# Patient Record
Sex: Female | Born: 2000 | Race: Black or African American | Hispanic: No | Marital: Single | Smoking: Never smoker
Health system: Southern US, Community
[De-identification: ages and names within clinical notes are randomized; demographics above are authoritative.]

## PROBLEM LIST (undated history)

## (undated) DIAGNOSIS — E538 Deficiency of other specified B group vitamins: Secondary | ICD-10-CM

## (undated) DIAGNOSIS — R7303 Prediabetes: Secondary | ICD-10-CM

## (undated) DIAGNOSIS — J45909 Unspecified asthma, uncomplicated: Secondary | ICD-10-CM

## (undated) DIAGNOSIS — R5383 Other fatigue: Secondary | ICD-10-CM

## (undated) DIAGNOSIS — E669 Obesity, unspecified: Secondary | ICD-10-CM

## (undated) DIAGNOSIS — F419 Anxiety disorder, unspecified: Secondary | ICD-10-CM

## (undated) DIAGNOSIS — R0602 Shortness of breath: Secondary | ICD-10-CM

## (undated) DIAGNOSIS — F32A Depression, unspecified: Secondary | ICD-10-CM

## (undated) HISTORY — DX: Other fatigue: R53.83

## (undated) HISTORY — DX: Prediabetes: R73.03

## (undated) HISTORY — DX: Obesity, unspecified: E66.9

## (undated) HISTORY — DX: Deficiency of other specified B group vitamins: E53.8

## (undated) HISTORY — DX: Unspecified asthma, uncomplicated: J45.909

## (undated) HISTORY — DX: Shortness of breath: R06.02

## (undated) HISTORY — DX: Anxiety disorder, unspecified: F41.9

## (undated) HISTORY — DX: Depression, unspecified: F32.A

---
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
## 2022-01-05 ENCOUNTER — Encounter (INDEPENDENT_AMBULATORY_CARE_PROVIDER_SITE_OTHER): Payer: Self-pay | Admitting: Family Medicine

## 2022-01-05 ENCOUNTER — Ambulatory Visit (INDEPENDENT_AMBULATORY_CARE_PROVIDER_SITE_OTHER): Payer: 59 | Admitting: Family Medicine

## 2022-01-05 VITALS — BP 113/76 | HR 67 | Temp 98.1°F | Ht 61.0 in | Wt 297.0 lb

## 2022-01-05 DIAGNOSIS — R0602 Shortness of breath: Secondary | ICD-10-CM | POA: Diagnosis not present

## 2022-01-05 DIAGNOSIS — J45909 Unspecified asthma, uncomplicated: Secondary | ICD-10-CM

## 2022-01-05 DIAGNOSIS — E559 Vitamin D deficiency, unspecified: Secondary | ICD-10-CM

## 2022-01-05 DIAGNOSIS — F5089 Other specified eating disorder: Secondary | ICD-10-CM | POA: Diagnosis not present

## 2022-01-05 DIAGNOSIS — Z1331 Encounter for screening for depression: Secondary | ICD-10-CM | POA: Diagnosis not present

## 2022-01-05 DIAGNOSIS — E669 Obesity, unspecified: Secondary | ICD-10-CM | POA: Diagnosis not present

## 2022-01-05 DIAGNOSIS — Z9189 Other specified personal risk factors, not elsewhere classified: Secondary | ICD-10-CM

## 2022-01-05 DIAGNOSIS — R5383 Other fatigue: Secondary | ICD-10-CM

## 2022-01-05 DIAGNOSIS — J4540 Moderate persistent asthma, uncomplicated: Secondary | ICD-10-CM

## 2022-01-05 DIAGNOSIS — J302 Other seasonal allergic rhinitis: Secondary | ICD-10-CM | POA: Diagnosis not present

## 2022-01-05 DIAGNOSIS — Z68.41 Body mass index (BMI) pediatric, greater than or equal to 95th percentile for age: Secondary | ICD-10-CM

## 2022-01-06 LAB — COMPREHENSIVE METABOLIC PANEL
ALT: 16 IU/L (ref 0–32)
AST: 20 IU/L (ref 0–40)
Albumin/Globulin Ratio: 1.2 (ref 1.2–2.2)
Albumin: 4.2 g/dL (ref 3.9–5.0)
Alkaline Phosphatase: 83 IU/L (ref 42–106)
BUN/Creatinine Ratio: 15 (ref 9–23)
BUN: 14 mg/dL (ref 6–20)
Bilirubin Total: 0.4 mg/dL (ref 0.0–1.2)
CO2: 22 mmol/L (ref 20–29)
Calcium: 9.8 mg/dL (ref 8.7–10.2)
Chloride: 103 mmol/L (ref 96–106)
Creatinine, Ser: 0.96 mg/dL (ref 0.57–1.00)
Globulin, Total: 3.4 g/dL (ref 1.5–4.5)
Glucose: 79 mg/dL (ref 70–99)
Potassium: 4.5 mmol/L (ref 3.5–5.2)
Sodium: 140 mmol/L (ref 134–144)
Total Protein: 7.6 g/dL (ref 6.0–8.5)
eGFR: 88 mL/min/{1.73_m2} (ref >59)

## 2022-01-06 LAB — CBC WITH DIFFERENTIAL/PLATELET
Basophils Absolute: 0 10*3/uL (ref 0.0–0.2)
Basos: 1 %
EOS (ABSOLUTE): 0.1 10*3/uL (ref 0.0–0.4)
Eos: 1 %
Hematocrit: 39.4 % (ref 34.0–46.6)
Hemoglobin: 12.6 g/dL (ref 11.1–15.9)
Immature Grans (Abs): 0 10*3/uL (ref 0.0–0.1)
Immature Granulocytes: 0 %
Lymphocytes Absolute: 2.3 10*3/uL (ref 0.7–3.1)
Lymphs: 43 %
MCH: 25.8 pg — ABNORMAL LOW (ref 26.6–33.0)
MCHC: 32 g/dL (ref 31.5–35.7)
MCV: 81 fL (ref 79–97)
Monocytes Absolute: 0.4 10*3/uL (ref 0.1–0.9)
Monocytes: 8 %
Neutrophils Absolute: 2.6 10*3/uL (ref 1.4–7.0)
Neutrophils: 47 %
Platelets: 413 10*3/uL (ref 150–450)
RBC: 4.88 x10E6/uL (ref 3.77–5.28)
RDW: 13.9 % (ref 11.7–15.4)
WBC: 5.4 10*3/uL (ref 3.4–10.8)

## 2022-01-06 LAB — VITAMIN D 25 HYDROXY (VIT D DEFICIENCY, FRACTURES): Vit D, 25-Hydroxy: 13.6 ng/mL — ABNORMAL LOW (ref 30.0–100.0)

## 2022-01-06 LAB — HEMOGLOBIN A1C
Est. average glucose Bld gHb Est-mCnc: 120 mg/dL
Hgb A1c MFr Bld: 5.8 % — ABNORMAL HIGH (ref 4.8–5.6)

## 2022-01-06 LAB — FOLATE: Folate: 11.8 ng/mL (ref >3.0)

## 2022-01-06 LAB — LIPID PANEL
Chol/HDL Ratio: 3.3 ratio (ref 0.0–4.4)
Cholesterol, Total: 204 mg/dL — ABNORMAL HIGH (ref 100–169)
HDL: 62 mg/dL (ref >39)
LDL Chol Calc (NIH): 119 mg/dL — ABNORMAL HIGH (ref 0–109)
Triglycerides: 129 mg/dL — ABNORMAL HIGH (ref 0–89)
VLDL Cholesterol Cal: 23 mg/dL (ref 5–40)

## 2022-01-06 LAB — VITAMIN B12: Vitamin B-12: 383 pg/mL (ref 232–1245)

## 2022-01-06 LAB — INSULIN, RANDOM: INSULIN: 23.2 u[IU]/mL (ref 2.6–24.9)

## 2022-01-06 LAB — T4, FREE: Free T4: 1.44 ng/dL (ref 0.93–1.60)

## 2022-01-06 LAB — TSH: TSH: 1.48 u[IU]/mL (ref 0.450–4.500)

## 2022-01-12 ENCOUNTER — Ambulatory Visit (INDEPENDENT_AMBULATORY_CARE_PROVIDER_SITE_OTHER): Payer: 59 | Admitting: Family Medicine

## 2022-01-19 ENCOUNTER — Ambulatory Visit (INDEPENDENT_AMBULATORY_CARE_PROVIDER_SITE_OTHER): Payer: Self-pay | Admitting: Family Medicine

## 2022-01-20 ENCOUNTER — Encounter (INDEPENDENT_AMBULATORY_CARE_PROVIDER_SITE_OTHER): Payer: Self-pay | Admitting: Family Medicine

## 2022-01-20 ENCOUNTER — Ambulatory Visit (INDEPENDENT_AMBULATORY_CARE_PROVIDER_SITE_OTHER): Payer: 59 | Admitting: Family Medicine

## 2022-01-20 VITALS — BP 102/70 | HR 60 | Temp 98.5°F | Ht 61.0 in | Wt 293.0 lb

## 2022-01-20 DIAGNOSIS — E785 Hyperlipidemia, unspecified: Secondary | ICD-10-CM

## 2022-01-20 DIAGNOSIS — F5089 Other specified eating disorder: Secondary | ICD-10-CM

## 2022-01-20 DIAGNOSIS — R7303 Prediabetes: Secondary | ICD-10-CM

## 2022-01-20 DIAGNOSIS — E538 Deficiency of other specified B group vitamins: Secondary | ICD-10-CM

## 2022-01-20 DIAGNOSIS — E559 Vitamin D deficiency, unspecified: Secondary | ICD-10-CM

## 2022-01-20 DIAGNOSIS — E66813 Obesity, class 3: Secondary | ICD-10-CM

## 2022-01-20 DIAGNOSIS — Z6841 Body Mass Index (BMI) 40.0 and over, adult: Secondary | ICD-10-CM

## 2022-01-20 DIAGNOSIS — E669 Obesity, unspecified: Secondary | ICD-10-CM

## 2022-01-20 MED ORDER — VITAMIN D (ERGOCALCIFEROL) 1.25 MG (50000 UNIT) PO CAPS: ORAL_CAPSULE | ORAL | 0 refills | Status: DC

## 2022-01-20 MED ORDER — CYANOCOBALAMIN 500 MCG PO TABS: 500.0000 ug | ORAL_TABLET | Freq: Every day | ORAL | Status: DC

## 2022-01-21 ENCOUNTER — Ambulatory Visit (INDEPENDENT_AMBULATORY_CARE_PROVIDER_SITE_OTHER): Payer: 59 | Admitting: Family Medicine

## 2022-01-21 NOTE — Progress Notes (Signed)
Chief Complaint:   OBESITY Melanie Blanchard (MR# [id number]) is a 21 y.o. female who presents for evaluation and treatment of obesity and related comorbidities. Current BMI is Body mass index is 56.12 kg/m. Melanie Blanchard has been struggling with her weight for many years and has been unsuccessful in either losing weight, maintaining weight loss, or reaching her healthy weight goal.  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 She lives with her mom only. Craves potatoes, fried chicken, and rice. Skips breakfast and mostly lunch as well. Worst habit is pizza and quick bites. She is here with her mom today.  Melanie Blanchard is currently in the action stage of change and ready to dedicate time achieving and maintaining a healthier weight. Melanie Blanchard is interested in becoming our patient and working on intensive lifestyle modifications including (but not limited to) diet and exercise for weight loss.  Melanie Blanchard's habits were reviewed today and are as follows: Her family eats meals together, she thinks her family will eat healthier with her, her desired weight loss is 112 lbs, she has been heavy most of her life, she started gaining weight in high school, her heaviest weight ever was 310 pounds, she has significant food cravings issues, she snacks frequently in the evenings, she skips meals frequently, she is frequently drinking liquids with calories, she frequently makes poor food choices, she frequently eats larger portions than normal, and she struggles with emotional eating.  Depression Screen Melanie Blanchard's Food and Mood (modified PHQ-9) score was 13.     01/05/2022    8:37 AM  Depression screen PHQ 2/9  Decreased Interest 3  Down, Depressed, Hopeless 1  PHQ - 2 Score 4  Altered sleeping 1  Tired, decreased energy 2  Change in appetite 3  Feeling bad or failure about yourself  1  Trouble concentrating 1  Moving slowly or fidgety/restless 1  Suicidal  thoughts 0  PHQ-9 Score 13  Difficult doing work/chores Somewhat difficult   Subjective:   1. Other fatigue Melanie Blanchard admits to daytime somnolence and admits to waking up still tired. Patient has a history of symptoms of daytime fatigue and morning fatigue. Melanie Blanchard generally gets 4 or 5 hours of sleep per night, and states that she has difficulty falling asleep. Snoring is present. Apneic episodes are not present. Epworth Sleepiness Score is 12. Melanie Blanchard doesn't feel OSA symptoms are present, neither does her mom but she has a hard time falling asleep.   2. SOB (shortness of breath) on exertion Melanie Blanchard notes increasing shortness of breath with exercising and seems to be worsening over time with weight gain. She notes getting out of breath sooner with activity than she used to. This has not gotten worse recently. Melanie Blanchard denies shortness of breath at rest or orthopnea.  3. Other disorder of eating, emotional eating Melanie Blanchard eats when she is stressed. Her PHQ-9 score is 13, but she denies feeling depressed. Mostly just stressed. Patient denies that she needs counseling at this time for emotional eating.   4. Reactive airway disease without complication, unspecified asthma severity, unspecified whether persistent Melanie Blanchard uses albuterol, and her PCP put her on Singulair in the past, and none for many years. RADx is environmentally mediated, uses albuterol rarely.   5. Seasonal allergies Melanie Blanchard is tolerating medication(s) well without side effects.  Medication compliance is good as patient endorses taking it as prescribed.  The patient denies additional concerns regarding this condition.  6. Vitamin D deficiency She is currently taking prescription vitamin D 50,000 IU each week. She denies nausea, vomiting or muscle weakness.  7. At risk for impaired metabolic function Melanie Blanchard is at increased risk for impaired metabolic function due to current nutrition and muscle  mass.  Assessment/Plan:   Orders Placed This Encounter  Procedures   CBC with Differential/Platelet   Comprehensive metabolic panel   Folate   Hemoglobin A1c   Insulin, random   Lipid panel   T4, free   Vitamin B12   VITAMIN D 25 Hydroxy (Vit-D Deficiency, Fractures)   TSH   TSH   EKG 12-Lead    1. Other fatigue Melanie Blanchard does feel that her weight is causing her energy to be lower than it should be. Fatigue may be related to obesity, depression or many other causes. Labs will be ordered, and in the meanwhile, Melanie Blanchard will focus on self care including making healthy food choices, increasing physical activity and focusing on stress reduction. Sleep meditation or sleep stories were discussed  with the patient as a manner to help "shut" her brain down at night to help her sleep. She was advised to use counseling at her school as needed if her school work stress increases.   - EKG 12-Lead - CBC with Differential/Platelet - Comprehensive metabolic panel - Folate - Hemoglobin A1c - Insulin, random - Lipid panel - T4, free - Vitamin B12 - VITAMIN D 25 Hydroxy (Vit-D Deficiency, Fractures) - TSH  2. SOB (shortness of breath) on exertion Melanie Blanchard does feel that she gets out of breath more easily that she used to when she exercises. Melanie Blanchard's shortness of breath appears to be obesity related and exercise induced. She has agreed to work on weight loss and gradually increase exercise to treat her exercise induced shortness of breath. Will continue to monitor closely.  - Lipid panel  3. Other disorder of eating, emotional eating We will monitor her symptoms closely. Behavior modification techniques were discussed today to help Melanie Blanchard deal with her emotional/non-hunger eating behaviors. Patient can use free counseling on campus if needed or Dr. Dewaine Blanchard. Orders and follow up as documented in patient record.   4. Reactive airway disease without complication, unspecified asthma severity,  unspecified whether persistent Discuss with PCP if Singulair is appropriate. Consider changing to Xyzal and refill albuterol as needed.   - CBC with Differential/Platelet - Comprehensive metabolic panel - Lipid panel  5. Seasonal allergies Melanie Blanchard's symptoms are stable currently. Will consider Singulair in the future if her allergies and RADx worsens.   6. Vitamin D deficiency We will check labs today. Low Vitamin D level contributes to fatigue and are associated with obesity, breast, and colon cancer. Melanie Blanchard will continue her medication per her PCP and will follow-up for routine testing of Vitamin D, at least 2-3 times per year to avoid over-replacement.  - VITAMIN D 25 Hydroxy (Vit-D Deficiency, Fractures)  7. Depression screening Melanie Blanchard had a positive depression screening. Depression is commonly associated with obesity and often results in emotional eating behaviors. We will monitor this closely and work on CBT to help improve the non-hunger eating patterns. Referral to Psychology may be required if no improvement is seen as she continues in our clinic.  8. At risk for impaired metabolic function Melanie Blanchard was given approximately 23 minutes of impaired  metabolic function prevention counseling today. We discussed intensive lifestyle modifications today with an emphasis on specific nutrition and exercise instructions and strategies.   Repetitive spaced learning was employed today  to elicit superior memory formation and behavioral change.  9. Class 3 severe obesity with serious comorbidity and body mass index (BMI) of 50.0 to 59.9 in adult, unspecified obesity type (HCC) Melanie Blanchard is currently in the action stage of change and her goal is to continue with weight loss efforts. I recommend Melanie Blanchard begin the structured treatment plan as follows:  She has agreed to the Category 2 Plan.  Exercise goals: As is.   Behavioral modification strategies: increasing lean protein intake, decreasing  simple carbohydrates, emotional eating strategies, and planning for success.  She was informed of the importance of frequent follow-up visits to maximize her success with intensive lifestyle modifications for her multiple health conditions. She was informed we would discuss her lab results at her next visit unless there is a critical issue that needs to be addressed sooner. Melanie Blanchard agreed to keep her next visit at the agreed upon time to discuss these results.  Objective:   Blood pressure 113/76, pulse 67, temperature 98.1 F (36.7 C), height 5\' 1"  (1.549 m), weight 297 lb (134.7 kg), last menstrual period 12/08/2021, SpO2 98 %. Body mass index is 56.12 kg/m.  EKG: Normal sinus rhythm, rate 72 BPM.  Indirect Calorimeter completed today shows a VO2 of 277 and a REE of 1915.  Her calculated basal metabolic rate is [id number] thus her basal metabolic rate is worse than expected.  General: Cooperative, alert, well developed, in no acute distress. HEENT: Conjunctivae and lids unremarkable. Cardiovascular: Regular rhythm.  Lungs: Normal work of breathing. Neurologic: No focal deficits.   Lab Results  Component Value Date   CREATININE 0.96 01/05/2022   BUN 14 01/05/2022   NA 140 01/05/2022   K 4.5 01/05/2022   CL 103 01/05/2022   CO2 22 01/05/2022   Lab Results  Component Value Date   ALT 16 01/05/2022   AST 20 01/05/2022   ALKPHOS 83 01/05/2022   BILITOT 0.4 01/05/2022   Lab Results  Component Value Date   HGBA1C 5.8 (H) 01/05/2022   HGBA1C 5.8 10/29/2021   HGBA1C 5.9 06/09/2017   HGBA1C 5.4 06/21/2013   Lab Results  Component Value Date   INSULIN 23.2 01/05/2022   Lab Results  Component Value Date   TSH 1.480 01/05/2022   Lab Results  Component Value Date   CHOL 204 (H) 01/05/2022   HDL 62 01/05/2022   LDLCALC 119 (H) 01/05/2022   LDLDIRECT 65.0 06/09/2017   TRIG 129 (H) 01/05/2022   CHOLHDL 3.3 01/05/2022   Lab Results  Component Value Date   WBC 5.4 01/05/2022    HGB 12.6 01/05/2022   HCT 39.4 01/05/2022   MCV 81 01/05/2022   PLT 413 01/05/2022   No results found for: IRON, TIBC, FERRITIN  Attestation Statements:   Reviewed by clinician on day of visit: allergies, medications, problem list, medical history, surgical history, family history, social history, and previous encounter notes.   Wilhemena Durie, am acting as transcriptionist for [employer], DO.  I have reviewed the above documentation for accuracy and completeness, and I agree with the above. Marjory Sneddon, D.O.  The [place] was signed into law in 2016 which includes the topic of electronic health records.  This provides immediate access to information in MyChart.  This includes consultation notes, operative notes, office notes, lab results and pathology reports.  If you have any questions about what you read please let us know at your next visit so we can discuss your concerns  and take corrective action if need be.  We are right here with you.

## 2022-01-26 ENCOUNTER — Ambulatory Visit (INDEPENDENT_AMBULATORY_CARE_PROVIDER_SITE_OTHER): Payer: 59 | Admitting: Family Medicine

## 2022-01-29 NOTE — Progress Notes (Signed)
Chief Complaint:   OBESITY Melanie Blanchard is here to discuss her progress with her obesity treatment plan along with follow-up of her obesity related diagnoses. Melanie Blanchard is on the Category 2 Plan with breakfast options and states she is following her eating plan approximately 75% of the time. Melanie Blanchard states she is not currently exercising.  Today's visit was #: 2 Starting weight: 297 lbs Starting date: 01/05/2022 Today's weight: 293 lbs Today's date: 01/20/2022 Total lbs lost to date: 4 Total lbs lost since last in-office visit: 4  Interim History: Melanie Blanchard is here today for her first follow-up office visit since starting the program with [place]. All blood work/ lab tests that were recently ordered by myself or an outside provider were reviewed with patient today per their request.  Extended time was spent counseling her on all new disease processes that were discovered or preexisting ones that are affected by BMI. She understands that many of these abnormalities will need to monitored regularly along with the current treatment plan of prudent dietary changes, in which we are making each and every office visit, to improve these health parameters. Pt reports that she works from 7p-11p and skipped dinner 60+% of the time. She is following the plan exactly for breakfast and lunch. Her snack calories include yogurt and string cheese. Pt does note some hunger, especially after skipping meals. She denies cravings.  Subjective:   1. Pre-diabetes New. Discussed labs with patient today. Melanie Blanchard's A1c is 5.8, and she denies ever being told that she is prediabetic.  Lab Results  Component Value Date   HGBA1C 5.8 (H) 01/05/2022   Lab Results  Component Value Date   INSULIN 23.2 01/05/2022   2. Vitamin D deficiency Worsening. Discussed labs with patient today. Pt's Vit D level is 13.6.  Lab Results  Component Value Date   VD25OH 13.6 (L) 01/05/2022   VD25OH 12.31 (L)  10/29/2021   3. Other disorder of eating, with emotional eating Discussed labs with patient today. Melanie Blanchard did not find herself doing any emotional eating the past couple of weeks. She denies issues or concerns. Pt denies depression.  4. Hyperlipidemia, unspecified hyperlipidemia type New. Discussed labs with patient today. Melanie Blanchard has elevated LDL and triglycerides. She is not on statin therapy. She has eaten a lot of saturated and trans fats in the past.  Lab Results  Component Value Date   ALT 16 01/05/2022   AST 20 01/05/2022   ALKPHOS 83 01/05/2022   BILITOT 0.4 01/05/2022   Lab Results  Component Value Date   CHOL 204 (H) 01/05/2022   HDL 62 01/05/2022   LDLCALC 119 (H) 01/05/2022   LDLDIRECT 65.0 06/09/2017   TRIG 129 (H) 01/05/2022   CHOLHDL 3.3 01/05/2022    5. B12 deficiency New. Discussed labs with patient today. Pt reports being tired and fatigued. In the past, she has eaten a rather unhealthy diet.  Assessment/Plan:  No orders of the defined types were placed in this encounter.   Medications Discontinued During This Encounter  Medication Reason   Vitamin D, Ergocalciferol, (DRISDOL) 1.25 MG (50000 UNIT) CAPS capsule Reorder     Meds ordered this encounter  Medications   Vitamin D, Ergocalciferol, (DRISDOL) 1.25 MG (50000 UNIT) CAPS capsule    Sig: 1 on wed and 1 po on sun    Dispense:  8 capsule    Refill:  0   vitamin B-12 (CYANOCOBALAMIN) 500 MCG tablet    Sig: Take 1 tablet (  500 mcg total) by mouth daily.     1. Pre-diabetes Melanie Blanchard will follow prudent nutritional plan, continue to work on weight loss, exercise, and decreasing simple carbohydrates to help decrease the risk of diabetes.  Extensive counseling done with pt today. We discussed the affects of simple carbohydrates on hunger and cravings.  2. Vitamin D deficiency - I discussed the importance of vitamin D to the patient's health and well-being.  - I reviewed possible symptoms of low  Vitamin D:  low energy, depressed mood, muscle aches, joint aches, osteoporosis etc. was reviewed with patient - low Vitamin D levels may be linked to an increased risk of cardiovascular events and even increased risk of cancers- such as colon and breast.  - ideal vitamin D levels reviewed with patient  - I recommend pt take a 50,000 IU twice weekly prescription vit D - see script below   - Informed patient this may be a lifelong thing, and she was encouraged to continue to take the medicine until told otherwise.    - weight loss will likely improve availability of vitamin D, thus encouraged Melanie Blanchard to continue with meal plan and their weight loss efforts to further improve this condition.  Thus, we will need to monitor levels regularly (every 3-4 mo on average) to keep levels within normal limits and prevent over supplementation. - pt's questions and concerns regarding this condition addressed.  Start- Vitamin D, Ergocalciferol, (DRISDOL) 1.25 MG (50000 UNIT) CAPS capsule; 1 on wed and 1 po on sun  Dispense: 8 capsule; Refill: 0  3. Other disorder of eating, with emotional eating Behavior modification techniques were discussed today to help Melanie Blanchard deal with her emotional/non-hunger eating behaviors.  Orders and follow up as documented in patient record. Pt declines need for counseling with Dr. Dewaine CongerBarker or any other. We will closely monitor.  4. Hyperlipidemia, unspecified hyperlipidemia type Cardiovascular risk and specific lipid/LDL goals reviewed.  We discussed several lifestyle modifications today and Melanie Blanchard will continue to work on diet, exercise and weight loss efforts. Orders and follow up as documented in patient record. Follow prudent nutritional plan and decrease saturated and trans fats. Extensive counseling done. Repeat labs in 4-6 months.  Counseling Intensive lifestyle modifications are the first line treatment for this issue. Dietary changes: Increase soluble fiber. Decrease simple  carbohydrates. Exercise changes: Moderate to vigorous-intensity aerobic activity 150 minutes per week if tolerated. Lipid-lowering medications: see documented in medical record.  5. B12 deficiency The diagnosis was reviewed with the patient. Counseling provided today, see below. We will continue to monitor. Orders and follow up as documented in patient record. Start B12 500 mcg daily. Repeat labs in 3 months.  Counseling The body needs vitamin B12: to make red blood cells; to make DNA; and to help the nerves work properly so they can carry messages from the brain to the body.  The main causes of vitamin B12 deficiency include dietary deficiency, digestive diseases, pernicious anemia, and having a surgery in which part of the stomach or small intestine is removed.  Certain medicines can make it harder for the body to absorb vitamin B12. These medicines include: heartburn medications; some antibiotics; some medications used to treat diabetes, gout, and high cholesterol.  In some cases, there are no symptoms of this condition. If the condition leads to anemia or nerve damage, various symptoms can occur, such as weakness or fatigue, shortness of breath, and numbness or tingling in your hands and feet.   Treatment:  May include taking vitamin  B12 supplements.  Avoid alcohol.  Eat lots of healthy foods that contain vitamin B12: Beef, pork, chicken, [place], and organ meats, such as liver.  Seafood: This includes clams, rainbow trout, salmon, tuna, and haddock. Eggs.  Cereal and dairy products that are fortified: This means that vitamin B12 has been added to the food.   Start- vitamin B-12 (CYANOCOBALAMIN) 500 MCG tablet; Take 1 tablet (500 mcg total) by mouth daily.  6. Obesity, current BMI 55.5 Melanie Blanchard is currently in the action stage of change. As such, her goal is to continue with weight loss efforts. She has agreed to the Category 2 Plan with breakfast and lunch options.   Pt will work on  getting all meal sin and not skipping dinner. She will change dinner with lunch proteins.  Exercise goals:  As is  Behavioral modification strategies: increasing lean protein intake, decreasing simple carbohydrates, increasing water intake, and planning for success.  Melanie Blanchard has agreed to follow-up with our clinic in 2 weeks. She was informed of the importance of frequent follow-up visits to maximize her success with intensive lifestyle modifications for her multiple health conditions.   Objective:   Blood pressure 102/70, pulse 60, temperature 98.5 F (36.9 C), height 5\' 1"  (1.549 m), weight 293 lb (132.9 kg), SpO2 100 %. Body mass index is 55.36 kg/m.  General: Cooperative, alert, well developed, in no acute distress. HEENT: Conjunctivae and lids unremarkable. Cardiovascular: Regular rhythm.  Lungs: Normal work of breathing. Neurologic: No focal deficits.   Lab Results  Component Value Date   CREATININE 0.96 01/05/2022   BUN 14 01/05/2022   NA 140 01/05/2022   K 4.5 01/05/2022   CL 103 01/05/2022   CO2 22 01/05/2022   Lab Results  Component Value Date   ALT 16 01/05/2022   AST 20 01/05/2022   ALKPHOS 83 01/05/2022   BILITOT 0.4 01/05/2022   Lab Results  Component Value Date   HGBA1C 5.8 (H) 01/05/2022   HGBA1C 5.8 10/29/2021   HGBA1C 5.9 06/09/2017   HGBA1C 5.4 06/21/2013   Lab Results  Component Value Date   INSULIN 23.2 01/05/2022   Lab Results  Component Value Date   TSH 1.480 01/05/2022   Lab Results  Component Value Date   CHOL 204 (H) 01/05/2022   HDL 62 01/05/2022   LDLCALC 119 (H) 01/05/2022   LDLDIRECT 65.0 06/09/2017   TRIG 129 (H) 01/05/2022   CHOLHDL 3.3 01/05/2022   Lab Results  Component Value Date   VD25OH 13.6 (L) 01/05/2022   VD25OH 12.31 (L) 10/29/2021   Lab Results  Component Value Date   WBC 5.4 01/05/2022   HGB 12.6 01/05/2022   HCT 39.4 01/05/2022   MCV 81 01/05/2022   PLT 413 01/05/2022   Attestation Statements:    Reviewed by clinician on day of visit: allergies, medications, problem list, medical history, surgical history, family history, social history, and previous encounter notes.  Time spent on visit including pre-visit chart review and post-visit care and charting was 48 minutes.   I, 03/07/2022, BS, CMA, am acting as transcriptionist for Kyung Rudd, DO.  I have reviewed the above documentation for accuracy and completeness, and I agree with the above. [employer], D.O.  The 21st Century Cures Act was signed into law in 2016 which includes the topic of electronic health records.  This provides immediate access to information in MyChart.  This includes consultation notes, operative notes, office notes, lab results and pathology reports.  If you have any questions about what you read please let us know at your next visit so we can discuss your concerns and take corrective action if need be.  We are right here with you.

## 2022-02-04 ENCOUNTER — Ambulatory Visit (INDEPENDENT_AMBULATORY_CARE_PROVIDER_SITE_OTHER): Payer: 59 | Admitting: Family Medicine

## 2022-02-08 ENCOUNTER — Encounter (INDEPENDENT_AMBULATORY_CARE_PROVIDER_SITE_OTHER): Payer: Self-pay | Admitting: Family Medicine

## 2022-02-08 ENCOUNTER — Ambulatory Visit (INDEPENDENT_AMBULATORY_CARE_PROVIDER_SITE_OTHER): Payer: 59 | Admitting: Family Medicine

## 2022-02-08 VITALS — BP 113/73 | HR 86 | Temp 97.9°F | Ht 61.0 in | Wt 295.0 lb

## 2022-02-08 DIAGNOSIS — E538 Deficiency of other specified B group vitamins: Secondary | ICD-10-CM | POA: Diagnosis not present

## 2022-02-08 DIAGNOSIS — Z794 Long term (current) use of insulin: Secondary | ICD-10-CM | POA: Diagnosis not present

## 2022-02-08 DIAGNOSIS — R7303 Prediabetes: Secondary | ICD-10-CM | POA: Diagnosis not present

## 2022-02-08 DIAGNOSIS — Z6841 Body Mass Index (BMI) 40.0 and over, adult: Secondary | ICD-10-CM

## 2022-02-08 DIAGNOSIS — E559 Vitamin D deficiency, unspecified: Secondary | ICD-10-CM | POA: Diagnosis not present

## 2022-02-08 DIAGNOSIS — E669 Obesity, unspecified: Secondary | ICD-10-CM

## 2022-02-08 MED ORDER — CYANOCOBALAMIN 500 MCG PO TABS: 500.0000 ug | ORAL_TABLET | Freq: Every day | ORAL | Status: DC

## 2022-02-13 NOTE — Progress Notes (Unsigned)
Chief Complaint:   OBESITY Melanie Blanchard is here to discuss her progress with her obesity treatment plan along with follow-up of her obesity related diagnoses. Melanie Blanchard is on the Category 2 Plan with lunch options and states she is following her eating plan approximately 75% of the time. Melanie Blanchard states she is not currently exercising.  Today's visit was #: 3 Starting weight: 297 lbs Starting date: 01/05/2022 Today's weight: 295 lbs Today's date: 02/11/2022 Total lbs lost to date: 6 Total lbs lost since last in-office visit: 2  Interim History: Melanie Blanchard did not pick up her B12 or Vitamin D supplements yet. She has been feeling a little down lately. Pt is not on meds and doesn't feel like she needs any. She is just not eating much lately. 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  Subjective:   1. B12 deficiency Pt reports low energy levels and her mood has been a little down lately.  2. Vitamin D deficiency She is currently prescribed prescription vitamin D 50,000 IU each week but has not started taking it, as she did not pick it up from the pharmacy. She denies nausea, vomiting or muscle weakness.  3. Pre-diabetes Melanie Blanchard denies hunger or cravings. She is not eating all pf her foods. She is also not on any meds and doesn't like to take them.  Assessment/Plan:  No orders of the defined types were placed in this encounter.   Medications Discontinued During This Encounter  Medication Reason   vitamin B-12 (CYANOCOBALAMIN) 500 MCG tablet Reorder     Meds ordered this encounter  Medications   vitamin B-12 (CYANOCOBALAMIN) 500 MCG tablet    Sig: Take 1 tablet (500 mcg total) by mouth daily. Over the counter     1. B12 deficiency The diagnosis was reviewed with the patient. Counseling provided today, see below. We will continue to monitor. Orders and follow up as documented in patient record. Start OTC B12 500 mcg. Pt will but OTC supplement- hand written on paper what pt  needs to buy.  Counseling The body needs vitamin B12: to make red blood cells; to make DNA; and to help the nerves work properly so they can carry messages from the brain to the body.  The main causes of vitamin B12 deficiency include dietary deficiency, digestive diseases, pernicious anemia, and having a surgery in which part of the stomach or small intestine is removed.  Certain medicines can make it harder for the body to absorb vitamin B12. These medicines include: heartburn medications; some antibiotics; some medications used to treat diabetes, gout, and high cholesterol.  In some cases, there are no symptoms of this condition. If the condition leads to anemia or nerve damage, various symptoms can occur, such as weakness or fatigue, shortness of breath, and numbness or tingling in your hands and feet.   Treatment:  May include taking vitamin B12 supplements.  Avoid alcohol.  Eat lots of healthy foods that contain vitamin B12: Beef, pork, chicken, [place], and organ meats, such as liver.  Seafood: This includes clams, rainbow trout, salmon, tuna, and haddock. Eggs.  Cereal and dairy products that are fortified: This means that vitamin B12 has been added to the food.   Start- vitamin B-12 (CYANOCOBALAMIN) 500 MCG tablet; Take 1 tablet (500 mcg total) by mouth daily. Over the counter  2. Vitamin D deficiency Low Vitamin D level contributes to fatigue and are associated with obesity, breast, and colon cancer. She agrees to start taking  prescription Vitamin D @50 ,000 IU every week and will follow-up for routine testing of Vitamin D, at least 2-3 times per year to avoid over-replacement. Obtain Rx from pharmacy.  3. Pre-diabetes Melanie Blanchard will continue to work on weight loss, exercise, and decreasing simple carbohydrates to help decrease the risk of diabetes.   4. Obesity, current BMI 55.7 Melanie Blanchard is currently in the action stage of change. As such, her goal is to continue with weight loss  efforts. She has agreed to the Category 2 Plan with lunch options.   Pt advised to help with mood, meditate via YouTube, CALM head space and exercise/walk  10 minutes or more a day.  Exercise goals:  As is  Behavioral modification strategies: increasing lean protein intake, decreasing simple carbohydrates, and avoiding temptations.  Melanie Blanchard has agreed to follow-up with our clinic in 2-3 weeks. She was informed of the importance of frequent follow-up visits to maximize her success with intensive lifestyle modifications for her multiple health conditions.   Objective:   Blood pressure 113/73, pulse 86, temperature 97.9 F (36.6 C), height 5\' 1"  (1.549 m), weight 295 lb (133.8 kg), SpO2 99 %. Body mass index is 55.74 kg/m.  General: Cooperative, alert, well developed, in no acute distress. HEENT: Conjunctivae and lids unremarkable. Cardiovascular: Regular rhythm.  Lungs: Normal work of breathing. Neurologic: No focal deficits.   Lab Results  Component Value Date   CREATININE 0.96 01/05/2022   BUN 14 01/05/2022   NA 140 01/05/2022   K 4.5 01/05/2022   CL 103 01/05/2022   CO2 22 01/05/2022   Lab Results  Component Value Date   ALT 16 01/05/2022   AST 20 01/05/2022   ALKPHOS 83 01/05/2022   BILITOT 0.4 01/05/2022   Lab Results  Component Value Date   HGBA1C 5.8 (H) 01/05/2022   HGBA1C 5.8 10/29/2021   HGBA1C 5.9 06/09/2017   HGBA1C 5.4 06/21/2013   Lab Results  Component Value Date   INSULIN 23.2 01/05/2022   Lab Results  Component Value Date   TSH 1.480 01/05/2022   Lab Results  Component Value Date   CHOL 204 (H) 01/05/2022   HDL 62 01/05/2022   LDLCALC 119 (H) 01/05/2022   LDLDIRECT 65.0 06/09/2017   TRIG 129 (H) 01/05/2022   CHOLHDL 3.3 01/05/2022   Lab Results  Component Value Date   VD25OH 13.6 (L) 01/05/2022   VD25OH 12.31 (L) 10/29/2021   Lab Results  Component Value Date   WBC 5.4 01/05/2022   HGB 12.6 01/05/2022   HCT 39.4 01/05/2022   MCV  81 01/05/2022   PLT 413 01/05/2022    Attestation Statements:   Reviewed by clinician on day of visit: allergies, medications, problem list, medical history, surgical history, family history, social history, and previous encounter notes.  I, 03/07/2022, BS, CMA, am acting as transcriptionist for 03/07/2022, DO.  I have reviewed the above documentation for accuracy and completeness, and I agree with the above. Kyung Rudd, D.O.  The 21st Century Cures Act was signed into law in 2016 which includes the topic of electronic health records.  This provides immediate access to information in MyChart.  This includes consultation notes, operative notes, office notes, lab results and pathology reports.  If you have any questions about what you read please let Carlye Grippe know at your next visit so we can discuss your concerns and take corrective action if need be.  We are right here with you.

## 2022-03-08 ENCOUNTER — Encounter (INDEPENDENT_AMBULATORY_CARE_PROVIDER_SITE_OTHER): Payer: Self-pay | Admitting: Family Medicine

## 2022-03-08 ENCOUNTER — Ambulatory Visit (INDEPENDENT_AMBULATORY_CARE_PROVIDER_SITE_OTHER): Payer: 59 | Admitting: Family Medicine

## 2022-03-08 VITALS — BP 138/73 | HR 78 | Temp 98.1°F | Ht 61.0 in | Wt 299.0 lb

## 2022-03-08 DIAGNOSIS — E538 Deficiency of other specified B group vitamins: Secondary | ICD-10-CM

## 2022-03-08 DIAGNOSIS — E669 Obesity, unspecified: Secondary | ICD-10-CM

## 2022-03-08 DIAGNOSIS — E559 Vitamin D deficiency, unspecified: Secondary | ICD-10-CM

## 2022-03-08 DIAGNOSIS — Z6841 Body Mass Index (BMI) 40.0 and over, adult: Secondary | ICD-10-CM | POA: Diagnosis not present

## 2022-03-08 MED ORDER — VITAMIN D (ERGOCALCIFEROL) 1.25 MG (50000 UNIT) PO CAPS: ORAL_CAPSULE | ORAL | 0 refills | Status: DC

## 2022-03-11 NOTE — Progress Notes (Signed)
Chief Complaint:   OBESITY Melanie Blanchard is here to discuss her progress with her obesity treatment plan along with follow-up of her obesity related diagnoses. Melanie Blanchard is on the Category 2 Plan with lunch options and states she is following her eating plan approximately 40% of the time. Melanie Blanchard states she is not currently exercising.  Today's visit was #: 4 Starting weight: 297 lbs Starting date: 01/05/2022 Today's weight: 299 lbs Today's date: 03/08/2022 Total lbs lost to date: 0 Total lbs lost since last in-office visit: +4  Interim History: Melanie Blanchard had 4th of July celebrations and a lot more "off plan" eating. She also went away for 3 days on a mini-vacation. She is traveling to [facility] for 2 weeks in the very near future.  Subjective:   1. B12 deficiency Pt did start OTC B12 500 mg daily and denies side effects. She reports no change in energy levels.  2. Vitamin D deficiency Pt started her Vit D supplement about 3 weeks ago. She is tolerating it well without side effects and compliance is great, as she takes it every Wednesday and every Sunday.  Assessment/Plan:  No orders of the defined types were placed in this encounter.   Medications Discontinued During This Encounter  Medication Reason   Vitamin D, Ergocalciferol, (DRISDOL) 1.25 MG (50000 UNIT) CAPS capsule Reorder     Meds ordered this encounter  Medications   Vitamin D, Ergocalciferol, (DRISDOL) 1.25 MG (50000 UNIT) CAPS capsule    Sig: 1 on wed and 1 po on sun    Dispense:  8 capsule    Refill:  0     1. B12 deficiency The diagnosis was reviewed with the patient. Counseling provided today, see below. We will continue to monitor. Orders and follow up as documented in patient record. Continue OTC B12 500 mcg daily and recheck level in 3 months.  Counseling The body needs vitamin B12: to make red blood cells; to make DNA; and to help the nerves work properly so they can carry messages from the brain to the body.   The main causes of vitamin B12 deficiency include dietary deficiency, digestive diseases, pernicious anemia, and having a surgery in which part of the stomach or small intestine is removed.  Certain medicines can make it harder for the body to absorb vitamin B12. These medicines include: heartburn medications; some antibiotics; some medications used to treat diabetes, gout, and high cholesterol.  In some cases, there are no symptoms of this condition. If the condition leads to anemia or nerve damage, various symptoms can occur, such as weakness or fatigue, shortness of breath, and numbness or tingling in your hands and feet.   Treatment:  May include taking vitamin B12 supplements.  Avoid alcohol.  Eat lots of healthy foods that contain vitamin B12: Beef, pork, chicken, [place], and organ meats, such as liver.  Seafood: This includes clams, rainbow trout, salmon, tuna, and haddock. Eggs.  Cereal and dairy products that are fortified: This means that vitamin B12 has been added to the food.   2. Vitamin D deficiency Low Vitamin D level contributes to fatigue and are associated with obesity, breast, and colon cancer. She agrees to continue to take prescription Vitamin D @50 ,000 IU twice a week and will follow-up for routine testing of Vitamin D, at least 2-3 times per year to avoid over-replacement.  Refill- Vitamin D, Ergocalciferol, (DRISDOL) 1.25 MG (50000 UNIT) CAPS capsule; 1 on wed and 1 po on sun  Dispense: 8 capsule;  Refill: 0  3. Obesity, current BMI 56.6 Melanie Blanchard is currently in the action stage of change. As such, her goal is to continue with weight loss efforts. She has agreed to the Category 2 Plan with lunch options.   Exercise goals:  As is for now.  Behavioral modification strategies: planning for success.  Melanie Blanchard has agreed to follow-up with our clinic in 3 weeks. She was informed of the importance of frequent follow-up visits to maximize her success with intensive lifestyle  modifications for her multiple health conditions.   Objective:   Blood pressure 138/73, pulse 78, temperature 98.1 F (36.7 C), height 5\' 1"  (1.549 m), weight 299 lb (135.6 kg), SpO2 99 %. Body mass index is 56.5 kg/m.  General: Cooperative, alert, well developed, in no acute distress. HEENT: Conjunctivae and lids unremarkable. Cardiovascular: Regular rhythm.  Lungs: Normal work of breathing. Neurologic: No focal deficits.   Lab Results  Component Value Date   CREATININE 0.96 01/05/2022   BUN 14 01/05/2022   NA 140 01/05/2022   K 4.5 01/05/2022   CL 103 01/05/2022   CO2 22 01/05/2022   Lab Results  Component Value Date   ALT 16 01/05/2022   AST 20 01/05/2022   ALKPHOS 83 01/05/2022   BILITOT 0.4 01/05/2022   Lab Results  Component Value Date   HGBA1C 5.8 (H) 01/05/2022   HGBA1C 5.8 10/29/2021   HGBA1C 5.9 06/09/2017   HGBA1C 5.4 06/21/2013   Lab Results  Component Value Date   INSULIN 23.2 01/05/2022   Lab Results  Component Value Date   TSH 1.480 01/05/2022   Lab Results  Component Value Date   CHOL 204 (H) 01/05/2022   HDL 62 01/05/2022   LDLCALC 119 (H) 01/05/2022   LDLDIRECT 65.0 06/09/2017   TRIG 129 (H) 01/05/2022   CHOLHDL 3.3 01/05/2022   Lab Results  Component Value Date   VD25OH 13.6 (L) 01/05/2022   VD25OH 12.31 (L) 10/29/2021   Lab Results  Component Value Date   WBC 5.4 01/05/2022   HGB 12.6 01/05/2022   HCT 39.4 01/05/2022   MCV 81 01/05/2022   PLT 413 01/05/2022    Attestation Statements:   Reviewed by clinician on day of visit: allergies, medications, problem list, medical history, surgical history, family history, social history, and previous encounter notes.  I, 03/07/2022, BS, CMA, am acting as transcriptionist for Kyung Rudd, DO.   I have reviewed the above documentation for accuracy and completeness, and I agree with the above. [employer], D.O.  The 21st Century Cures Act was signed into law in 2016  which includes the topic of electronic health records.  This provides immediate access to information in MyChart.  This includes consultation notes, operative notes, office notes, lab results and pathology reports.  If you have any questions about what you read please let 2017 know at your next visit so we can discuss your concerns and take corrective action if need be.  We are right here with you.

## 2022-03-22 ENCOUNTER — Ambulatory Visit (INDEPENDENT_AMBULATORY_CARE_PROVIDER_SITE_OTHER): Payer: 59 | Admitting: Family Medicine

## 2022-03-29 ENCOUNTER — Ambulatory Visit (INDEPENDENT_AMBULATORY_CARE_PROVIDER_SITE_OTHER): Payer: 59 | Admitting: Family Medicine

## 2022-04-01 ENCOUNTER — Ambulatory Visit (INDEPENDENT_AMBULATORY_CARE_PROVIDER_SITE_OTHER): Payer: 59 | Admitting: Family Medicine

## 2022-04-01 ENCOUNTER — Encounter (INDEPENDENT_AMBULATORY_CARE_PROVIDER_SITE_OTHER): Payer: Self-pay | Admitting: Family Medicine

## 2022-04-01 VITALS — BP 118/77 | HR 76 | Temp 97.7°F | Ht 61.0 in | Wt 298.0 lb

## 2022-04-01 DIAGNOSIS — E669 Obesity, unspecified: Secondary | ICD-10-CM

## 2022-04-01 DIAGNOSIS — E559 Vitamin D deficiency, unspecified: Secondary | ICD-10-CM

## 2022-04-01 DIAGNOSIS — Z6841 Body Mass Index (BMI) 40.0 and over, adult: Secondary | ICD-10-CM

## 2022-04-01 DIAGNOSIS — G4709 Other insomnia: Secondary | ICD-10-CM | POA: Diagnosis not present

## 2022-04-01 DIAGNOSIS — E538 Deficiency of other specified B group vitamins: Secondary | ICD-10-CM | POA: Diagnosis not present

## 2022-04-01 DIAGNOSIS — E66813 Obesity, class 3: Secondary | ICD-10-CM

## 2022-04-01 MED ORDER — VITAMIN D (ERGOCALCIFEROL) 1.25 MG (50000 UNIT) PO CAPS: ORAL_CAPSULE | ORAL | 0 refills | Status: DC

## 2022-04-07 ENCOUNTER — Encounter (INDEPENDENT_AMBULATORY_CARE_PROVIDER_SITE_OTHER): Payer: Self-pay

## 2022-04-09 NOTE — Progress Notes (Unsigned)
Chief Complaint:   OBESITY Melanie Blanchard is here to discuss her progress with her obesity treatment plan along with follow-up of her obesity related diagnoses. Melanie Blanchard is on the Category 2 Plan with lunch options and states she is following her eating plan approximately 50% of the time. Melanie Blanchard states she is not currently exercising.  Today's visit was #: 5 Starting weight: 297 lbs Starting date: 01/05/2022 Today's weight: 298 lbs Today's date: 04/01/2022 Total lbs lost to date: 0 Total lbs lost since last in-office visit: 1  Interim History: Melanie Blanchard had her birthday celebration recently and still lost a pound. She is still not meal prepping as well as she would like to. Pt reports she has not been sleeping well lately. She works [employer] at [employer]. She says it is difficult to go to sleep right away.  Subjective:   1. Other insomnia Pt reports being tired. She notes it has been difficult to fall asleep the past few weeks. She has been waking up early in the mornings to help others and not getting 7-9 hours nightly.  2. Vitamin D deficiency She is currently taking prescription vitamin D 50,000 IU twice a week. She denies nausea, vomiting or muscle weakness.  3. B12 deficiency Melanie Blanchard is taking OTC B12 500 mcg daily.  Assessment/Plan:  No orders of the defined types were placed in this encounter.   Medications Discontinued During This Encounter  Medication Reason   Vitamin D, Ergocalciferol, (DRISDOL) 1.25 MG (50000 UNIT) CAPS capsule Reorder     Meds ordered this encounter  Medications   Vitamin D, Ergocalciferol, (DRISDOL) 1.25 MG (50000 UNIT) CAPS capsule    Sig: 1 on wed and 1 po on sun    Dispense:  8 capsule    Refill:  0     1. Other insomnia We discussed the effects of blue light on melatonin and sleep patterns, including the importance of a sleep schedule and routines.  The problem of recurrent insomnia was discussed. Orders and follow up as documented in patient  record. Counseling: Intensive lifestyle modifications are the first line treatment for this issue. We discussed several lifestyle modifications today and she will continue to work on diet, exercise and weight loss efforts.   Counseling Limit or avoid alcohol, caffeinated beverages, and cigarettes, especially close to bedtime.  Do not eat a large meal or eat spicy foods right before bedtime. This can lead to digestive discomfort that can make it hard for you to sleep. Keep a sleep diary to help you and your health care provider figure out what could be causing your insomnia.  Make your bedroom a dark, comfortable place where it is easy to fall asleep. Put up shades or blackout curtains to block light from outside. Use a white noise machine to block noise. Keep the temperature cool. Limit screen use before bedtime. This includes: Watching TV. Using your smartphone, tablet, or computer. Stick to a routine that includes going to bed and waking up at the same times every day and night. This can help you fall asleep faster. Consider making a quiet activity, such as reading, part of your nighttime routine. Try to avoid taking naps during the day so that you sleep better at night. Get out of bed if you are still awake after 15 minutes of trying to sleep. Keep the lights down, but try reading or doing a quiet activity. When you feel sleepy, go back to bed.  2. Vitamin D deficiency Low Vitamin D level  contributes to fatigue and are associated with obesity, breast, and colon cancer. She agrees to continue to take prescription Vitamin D 50,000 IU twice a week and will follow-up for routine testing of Vitamin D, at least 2-3 times per year to avoid over-replacement.  Refill- Vitamin D, Ergocalciferol, (DRISDOL) 1.25 MG (50000 UNIT) CAPS capsule; 1 on wed and 1 po on sun  Dispense: 8 capsule; Refill: 0  3. B12 deficiency The diagnosis was reviewed with the patient. Counseling provided today, see below. We  will continue to monitor. Orders and follow up as documented in patient record. Continue current treatment plan.  Counseling The body needs vitamin B12: to make red blood cells; to make DNA; and to help the nerves work properly so they can carry messages from the brain to the body.  The main causes of vitamin B12 deficiency include dietary deficiency, digestive diseases, pernicious anemia, and having a surgery in which part of the stomach or small intestine is removed.  Certain medicines can make it harder for the body to absorb vitamin B12. These medicines include: heartburn medications; some antibiotics; some medications used to treat diabetes, gout, and high cholesterol.  In some cases, there are no symptoms of this condition. If the condition leads to anemia or nerve damage, various symptoms can occur, such as weakness or fatigue, shortness of breath, and numbness or tingling in your hands and feet.   Treatment:  May include taking vitamin B12 supplements.  Avoid alcohol.  Eat lots of healthy foods that contain vitamin B12: Beef, pork, chicken, [place], and organ meats, such as liver.  Seafood: This includes clams, rainbow trout, salmon, tuna, and haddock. Eggs.  Cereal and dairy products that are fortified: This means that vitamin B12 has been added to the food.   4. Obesity, current BMI 56.5 Melanie Blanchard is currently in the action stage of change. As such, her goal is to continue with weight loss efforts. She has agreed to the Category 2 Plan with lunch options.   Handout: Recipe Packet 1 Meal ideas discussed with pt.  Recommend: Melanie Blanchard 10 cal/2tbs dressings and marinades; [place] [employer] sugar-free salad dressing  Exercise goals:  As is  Behavioral modification strategies: Pt will focus on eating her dinner meal daily; no skipping meals and meal planning and cooking strategies.  Melanie Blanchard has agreed to follow-up with our clinic in 3 weeks. She was informed of the importance of frequent  follow-up visits to maximize her success with intensive lifestyle modifications for her multiple health conditions.   Objective:   Blood pressure 118/77, pulse 76, temperature 97.7 F (36.5 C), height 5\' 1"  (1.549 m), weight 298 lb (135.2 kg), SpO2 99 %. Body mass index is 56.31 kg/m.  General: Cooperative, alert, well developed, in no acute distress. HEENT: Conjunctivae and lids unremarkable. Cardiovascular: Regular rhythm.  Lungs: Normal work of breathing. Neurologic: No focal deficits.   Lab Results  Component Value Date   CREATININE 0.96 01/05/2022   BUN 14 01/05/2022   NA 140 01/05/2022   K 4.5 01/05/2022   CL 103 01/05/2022   CO2 22 01/05/2022   Lab Results  Component Value Date   ALT 16 01/05/2022   AST 20 01/05/2022   ALKPHOS 83 01/05/2022   BILITOT 0.4 01/05/2022   Lab Results  Component Value Date   HGBA1C 5.8 (H) 01/05/2022   HGBA1C 5.8 10/29/2021   HGBA1C 5.9 06/09/2017   HGBA1C 5.4 06/21/2013   Lab Results  Component Value Date   INSULIN  23.2 01/05/2022   Lab Results  Component Value Date   TSH 1.480 01/05/2022   Lab Results  Component Value Date   CHOL 204 (H) 01/05/2022   HDL 62 01/05/2022   LDLCALC 119 (H) 01/05/2022   LDLDIRECT 65.0 06/09/2017   TRIG 129 (H) 01/05/2022   CHOLHDL 3.3 01/05/2022   Lab Results  Component Value Date   VD25OH 13.6 (L) 01/05/2022   VD25OH 12.31 (L) 10/29/2021   Lab Results  Component Value Date   WBC 5.4 01/05/2022   HGB 12.6 01/05/2022   HCT 39.4 01/05/2022   MCV 81 01/05/2022   PLT 413 01/05/2022    Attestation Statements:   Reviewed by clinician on day of visit: allergies, medications, problem list, medical history, surgical history, family history, social history, and previous encounter notes.  I, Kyung Rudd, BS, CMA, am acting as transcriptionist for [employer], DO.  I have reviewed the above documentation for accuracy and completeness, and I agree with the above. Carlye Grippe,  D.O.  The 21st Century Cures Act was signed into law in 2016 which includes the topic of electronic health records.  This provides immediate access to information in MyChart.  This includes consultation notes, operative notes, office notes, lab results and pathology reports.  If you have any questions about what you read please let us know at your next visit so we can discuss your concerns and take corrective action if need be.  We are right here with you.

## 2022-04-22 ENCOUNTER — Encounter (INDEPENDENT_AMBULATORY_CARE_PROVIDER_SITE_OTHER): Payer: Self-pay | Admitting: Family Medicine

## 2022-04-22 ENCOUNTER — Ambulatory Visit (INDEPENDENT_AMBULATORY_CARE_PROVIDER_SITE_OTHER): Payer: 59 | Admitting: Family Medicine

## 2022-04-22 VITALS — BP 112/73 | HR 56 | Temp 97.5°F | Ht 61.0 in | Wt 299.0 lb

## 2022-04-22 DIAGNOSIS — Z6841 Body Mass Index (BMI) 40.0 and over, adult: Secondary | ICD-10-CM | POA: Diagnosis not present

## 2022-04-22 DIAGNOSIS — E559 Vitamin D deficiency, unspecified: Secondary | ICD-10-CM | POA: Diagnosis not present

## 2022-04-22 DIAGNOSIS — F4321 Adjustment disorder with depressed mood: Secondary | ICD-10-CM

## 2022-04-22 DIAGNOSIS — E669 Obesity, unspecified: Secondary | ICD-10-CM | POA: Diagnosis not present

## 2022-04-22 MED ORDER — VITAMIN D (ERGOCALCIFEROL) 1.25 MG (50000 UNIT) PO CAPS: ORAL_CAPSULE | ORAL | 0 refills | Status: DC

## 2022-04-22 MED ORDER — BUPROPION HCL ER (XL) 150 MG PO TB24: 150.0000 mg | ORAL_TABLET | ORAL | 0 refills | Status: DC

## 2022-04-28 NOTE — Progress Notes (Signed)
Chief Complaint:   OBESITY Melanie Blanchard is here to discuss her progress with her obesity treatment plan along with follow-up of her obesity related diagnoses. Melanie Blanchard is on the Category 2 Plan with lunch options and states she is following her eating plan approximately 75% of the time. Melanie Blanchard states she is not currently exercising.  Today's visit was #: 6 Starting weight: 297 lbs Starting date: 01/05/2022 Today's weight: 299 lbs Today's date: 04/22/2022 Total lbs lost to date: 0 Total lbs lost since last in-office visit: +1  Interim History: Melanie Blanchard is still not eating all of her foods and is only eating 1 meal a day. She is experiencing food insecurities, and for the first time, she admits today that she struggles with depression and has anger issues.  Subjective:   1. Adjustment disorder with depressed mood Melanie Blanchard denies suicidal or homicidal ideations.  2. Vitamin D deficiency She is currently taking prescription vitamin D 50,000 IU twice a week. She denies nausea, vomiting or muscle weakness.  Assessment/Plan:  No orders of the defined types were placed in this encounter.   Medications Discontinued During This Encounter  Medication Reason   loratadine (CLARITIN) 10 MG tablet    Vitamin D, Ergocalciferol, (DRISDOL) 1.25 MG (50000 UNIT) CAPS capsule Reorder     Meds ordered this encounter  Medications   Vitamin D, Ergocalciferol, (DRISDOL) 1.25 MG (50000 UNIT) CAPS capsule    Sig: 1 on wed and 1 po on sun    Dispense:  8 capsule    Refill:  0   buPROPion (WELLBUTRIN XL) 150 MG 24 hr tablet    Sig: Take 1 tablet (150 mg total) by mouth every morning.    Dispense:  30 tablet    Refill:  0     1. Adjustment disorder with depressed mood Start Wellbutrin 150 mg, after risks and benefits of meds discussed with pt. Start counseling. Increase movement.  Start- buPROPion (WELLBUTRIN XL) 150 MG 24 hr tablet; Take 1 tablet (150 mg total) by mouth every morning.  Dispense:  30 tablet; Refill: 0  2. Vitamin D deficiency Low Vitamin D level contributes to fatigue and are associated with obesity, breast, and colon cancer. She agrees to continue to take prescription Vitamin D 50,000 IU twice a week and will follow-up for routine testing of Vitamin D, at least 2-3 times per year to avoid over-replacement.  Refill- Vitamin D, Ergocalciferol, (DRISDOL) 1.25 MG (50000 UNIT) CAPS capsule; 1 on wed and 1 po on sun  Dispense: 8 capsule; Refill: 0  3. Obesity, current BMI 56.5 Melanie Blanchard is currently in the action stage of change. As such, her goal is to continue with weight loss efforts. She has agreed to the Category 2 Plan with lunch options.   Reflect on: What life looks like taking bette care of herself. Contact counselor for an appointment. Food assistance information given to pt.  Exercise goals: All adults should avoid inactivity. Some physical activity is better than none, and adults who participate in any amount of physical activity gain some health benefits.  Behavioral modification strategies: increasing lean protein intake, no skipping meals, and planning for success.  Melanie Blanchard has agreed to follow-up with our clinic in 3-4 weeks, after starting Metformin. She was informed of the importance of frequent follow-up visits to maximize her success with intensive lifestyle modifications for her multiple health conditions.   Objective:   Blood pressure 112/73, pulse (!) 56, temperature (!) 97.5 F (36.4 C), height 5\' 1"  (1.549  m), weight 299 lb (135.6 kg), SpO2 98 %. Body mass index is 56.5 kg/m.  General: Cooperative, alert, well developed, in no acute distress. HEENT: Conjunctivae and lids unremarkable. Cardiovascular: Regular rhythm.  Lungs: Normal work of breathing. Neurologic: No focal deficits.   Lab Results  Component Value Date   CREATININE 0.96 01/05/2022   BUN 14 01/05/2022   NA 140 01/05/2022   K 4.5 01/05/2022   CL 103 01/05/2022   CO2 22  01/05/2022   Lab Results  Component Value Date   ALT 16 01/05/2022   AST 20 01/05/2022   ALKPHOS 83 01/05/2022   BILITOT 0.4 01/05/2022   Lab Results  Component Value Date   HGBA1C 5.8 (H) 01/05/2022   HGBA1C 5.8 10/29/2021   HGBA1C 5.9 06/09/2017   HGBA1C 5.4 06/21/2013   Lab Results  Component Value Date   INSULIN 23.2 01/05/2022   Lab Results  Component Value Date   TSH 1.480 01/05/2022   Lab Results  Component Value Date   CHOL 204 (H) 01/05/2022   HDL 62 01/05/2022   LDLCALC 119 (H) 01/05/2022   LDLDIRECT 65.0 06/09/2017   TRIG 129 (H) 01/05/2022   CHOLHDL 3.3 01/05/2022   Lab Results  Component Value Date   VD25OH 13.6 (L) 01/05/2022   VD25OH 12.31 (L) 10/29/2021   Lab Results  Component Value Date   WBC 5.4 01/05/2022   HGB 12.6 01/05/2022   HCT 39.4 01/05/2022   MCV 81 01/05/2022   PLT 413 01/05/2022   Attestation Statements:   Reviewed by clinician on day of visit: allergies, medications, problem list, medical history, surgical history, family history, social history, and previous encounter notes.  I, Kyung Rudd, BS, CMA, am acting as transcriptionist for [employer], DO.   I have reviewed the above documentation for accuracy and completeness, and I agree with the above. Carlye Grippe, D.O.  The 21st Century Cures Act was signed into law in 2016 which includes the topic of electronic health records.  This provides immediate access to information in MyChart.  This includes consultation notes, operative notes, office notes, lab results and pathology reports.  If you have any questions about what you read please let us know at your next visit so we can discuss your concerns and take corrective action if need be.  We are right here with you.

## 2022-05-13 ENCOUNTER — Encounter (INDEPENDENT_AMBULATORY_CARE_PROVIDER_SITE_OTHER): Payer: Self-pay | Admitting: Family Medicine

## 2022-05-13 ENCOUNTER — Ambulatory Visit (INDEPENDENT_AMBULATORY_CARE_PROVIDER_SITE_OTHER): Payer: Commercial Managed Care - HMO | Admitting: Family Medicine

## 2022-05-13 VITALS — BP 121/79 | HR 68 | Temp 98.3°F | Ht 61.0 in | Wt 299.0 lb

## 2022-05-13 DIAGNOSIS — Z6841 Body Mass Index (BMI) 40.0 and over, adult: Secondary | ICD-10-CM

## 2022-05-13 DIAGNOSIS — F4321 Adjustment disorder with depressed mood: Secondary | ICD-10-CM | POA: Diagnosis not present

## 2022-05-13 DIAGNOSIS — G4700 Insomnia, unspecified: Secondary | ICD-10-CM

## 2022-05-13 DIAGNOSIS — E669 Obesity, unspecified: Secondary | ICD-10-CM | POA: Diagnosis not present

## 2022-05-13 DIAGNOSIS — E559 Vitamin D deficiency, unspecified: Secondary | ICD-10-CM

## 2022-05-13 DIAGNOSIS — G4709 Other insomnia: Secondary | ICD-10-CM | POA: Diagnosis not present

## 2022-05-13 MED ORDER — BUPROPION HCL ER (XL) 150 MG PO TB24: 150.0000 mg | ORAL_TABLET | ORAL | 0 refills | Status: DC

## 2022-05-13 MED ORDER — VITAMIN D (ERGOCALCIFEROL) 1.25 MG (50000 UNIT) PO CAPS: ORAL_CAPSULE | ORAL | 0 refills | Status: DC

## 2022-05-13 MED ORDER — MELATONIN 3 MG PO TABS: 3.0000 mg | ORAL_TABLET | Freq: Every day | ORAL | 0 refills | Status: DC

## 2022-05-21 NOTE — Progress Notes (Unsigned)
Chief Complaint:   OBESITY Melanie Blanchard is here to discuss her progress with her obesity treatment plan along with follow-up of her obesity related diagnoses. Melanie Blanchard is on the Category 2 Plan with lunch options and states she is following her eating plan approximately 35% of the time. Melanie Blanchard states she is not currently exercising.  Today's visit was #: 7 Starting weight: 297 lbs Starting date: 01/05/2022 Today's weight: 299 lbs Today's date: 05/13/2022 Total lbs lost to date: 0 lbs Total lbs lost since last in-office visit: 0 lbs  Interim History: Melanie Blanchard's mom has moved to [place] and she is having a difficult time emotionally with her gone. 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 Melanie Blanchard denies suicidal ideations or significant depressed feelings, but feels "like she lost her best friend".  Subjective:   1. Adjustment disorder with depressed mood Melanie Blanchard started Wellbutrin at last office visit. She is tolerating it well and has less mood swings, so it is helping. She has a counseling appointment on May 31, 2022 with Dr. Logan Blanchard. I congratulated her on calling for a counseling appointment; strongly advised her to keep it!  2. Other insomnia This is new for Melanie Blanchard. She is having trouble falling asleep at bedtime. [place] in bed by 12:30 am. She wakes up between 8 and 8:30 am. : 3. Vitamin D deficiency Compliance is good and no side effects.  Assessment/Plan:  No orders of the defined types were placed in this encounter.   Medications Discontinued During This Encounter  Medication Reason   Vitamin D, Ergocalciferol, (DRISDOL) 1.25 MG (50000 UNIT) CAPS capsule Reorder   buPROPion (WELLBUTRIN XL) 150 MG 24 hr tablet Reorder     Meds ordered this encounter  Medications   buPROPion (WELLBUTRIN XL) 150 MG 24 hr tablet    Sig: Take 1 tablet (150 mg total) by mouth every morning.     Dispense:  30 tablet    Refill:  0   Vitamin D, Ergocalciferol, (DRISDOL) 1.25 MG (50000 UNIT) CAPS capsule    Sig: 1 on wed and 1 po on sun    Dispense:  8 capsule    Refill:  0   melatonin 3 MG TABS tablet    Sig: Take 1 tablet (3 mg total) by mouth at bedtime.    Refill:  0     1. Adjustment disorder with depressed mood Discussed the following with Melanie Blanchard:  - more movement/exercise to help with her mood - focus on school and creating a "to do" list to help with her procrastination that she claims to have. Will refill Wellbutrin as follows: - buPROPion (WELLBUTRIN XL) 150 MG 24 hr tablet; Take 1 tablet (150 mg total) by mouth every morning.  Dispense: 30 tablet; Refill: 0  2. Other insomnia Melanie Blanchard was advised to exercise daily and start Melatonin 3 mg at bedtime, maximum 10 mg at night time. Also to try meditation at night - she showed her various appointments her. - melatonin 3 MG TABS tablet; Take 1 tablet (3 mg total) by mouth at bedtime.; Refill: 0  3. Vitamin D deficiency - I again reiterated the importance of vitamin D (as well as calcium) to their Blanchard and wellbeing.  - I reviewed possible symptoms of low Vitamin D:  low energy, depressed mood, muscle aches, joint aches, osteoporosis etc. - low Vitamin D levels may be linked  to an increased risk of cardiovascular events and even increased risk of cancers- such as colon and breast.  - ideal vitamin D levels reviewed with patient  - I recommend pt take a  weekly prescription vit D - see script below   - Informed patient this may be a lifelong thing, and she was encouraged to continue to take the medicine until told otherwise.    - weight loss will likely improve availability of vitamin D, thus encouraged Melanie Blanchard to continue with meal plan and their weight loss efforts to further improve this condition.  Thus, we will need to monitor levels regularly (every 3-4 mo on average) to keep levels within normal limits and prevent over  supplementation. - pt's questions and concerns regarding this condition addressed. Will refill Vitamin D as follows:  - Vitamin D, Ergocalciferol, (DRISDOL) 1.25 MG (50000 UNIT) CAPS capsule; 1 on wed and 1 po on sun  Dispense: 8 capsule; Refill: 0  4. Obesity, current BMI 56.5 [name] is currently in the action stage of change. As such, her goal is to continue with weight loss efforts. She has agreed to the [place].   [name]'s goal is to got to the gym for 30 minutes each day, to create a schedule on her day planner; "to do list".  Sleep hygiene was dicussed today to help with regularity. Exercise goals: All adults should avoid inactivity. Some physical activity is better than none, and adults who participate in any amount of physical activity gain some Blanchard benefits.  Behavioral modification strategies: increasing water intake, meal planning and cooking strategies, and planning for success.  [name] has agreed to follow-up with our clinic in 2-3 weeks. She was informed of the importance of frequent follow-up visits to maximize her success with intensive lifestyle modifications for her multiple Blanchard conditions.   Objective:   Blood pressure 121/79, pulse 68, temperature 98.3 F (36.8 C), height 5\' 1"  (1.549 m), weight 299 lb (135.6 kg), SpO2 98 %. Body mass index is 56.5 kg/m.  General: Cooperative, alert, well developed, in no acute distress. HEENT: Conjunctivae and lids unremarkable. Cardiovascular: Regular rhythm.  Lungs: Normal work of breathing. Neurologic: No focal deficits.   Lab Results  Component Value Date   CREATININE 0.96 01/05/2022   BUN 14 01/05/2022   NA 140 01/05/2022   K 4.5 01/05/2022   CL 103 01/05/2022   CO2 22 01/05/2022   Lab Results  Component Value Date   ALT 16 01/05/2022   AST 20 01/05/2022   ALKPHOS 83 01/05/2022   BILITOT 0.4 01/05/2022   Lab Results  Component Value Date   HGBA1C 5.8 (H) 01/05/2022   HGBA1C 5.8 10/29/2021    HGBA1C 5.9 06/09/2017   HGBA1C 5.4 06/21/2013   Lab Results  Component Value Date   INSULIN 23.2 01/05/2022   Lab Results  Component Value Date   TSH 1.480 01/05/2022   Lab Results  Component Value Date   CHOL 204 (H) 01/05/2022   HDL 62 01/05/2022   LDLCALC 119 (H) 01/05/2022   LDLDIRECT 65.0 06/09/2017   TRIG 129 (H) 01/05/2022   CHOLHDL 3.3 01/05/2022   Lab Results  Component Value Date   VD25OH 13.6 (L) 01/05/2022   VD25OH 12.31 (L) 10/29/2021   Lab Results  Component Value Date   WBC 5.4 01/05/2022   HGB 12.6 01/05/2022   HCT 39.4 01/05/2022   MCV 81 01/05/2022   PLT 413 01/05/2022   No results found for: "IRON", "TIBC", "FERRITIN"  Attestation Statements:   Reviewed by clinician on day of visit: allergies, medications, problem list, medical history, surgical history, family history, social history, and previous encounter notes.  Time spent on visit including pre-visit chart review and post-visit care and charting was 42 minutes.   ILennette Bihari, CMA, am acting as transcriptionist for Dr. Raliegh Scarlet, DO.  I have reviewed the above documentation for accuracy and completeness, and I agree with the above. Marjory Sneddon, D.O.  The [place] was signed into law in 2016 which includes the topic of electronic Blanchard records.  This provides immediate access to information in MyChart.  This includes consultation notes, operative notes, office notes, lab results and pathology reports.  If you have any questions about what you read please let us know at your next visit so we can discuss your concerns and take corrective action if need be.  We are right here with you.

## 2022-06-09 ENCOUNTER — Ambulatory Visit (INDEPENDENT_AMBULATORY_CARE_PROVIDER_SITE_OTHER): Payer: Commercial Managed Care - HMO | Admitting: Family Medicine

## 2022-06-09 ENCOUNTER — Other Ambulatory Visit (INDEPENDENT_AMBULATORY_CARE_PROVIDER_SITE_OTHER): Payer: Self-pay | Admitting: Family Medicine

## 2022-06-09 ENCOUNTER — Other Ambulatory Visit: Payer: Self-pay | Admitting: Family Medicine

## 2022-06-09 ENCOUNTER — Encounter (INDEPENDENT_AMBULATORY_CARE_PROVIDER_SITE_OTHER): Payer: Self-pay | Admitting: Family Medicine

## 2022-06-09 VITALS — BP 120/79 | HR 95 | Temp 98.3°F | Ht 61.0 in | Wt 313.0 lb

## 2022-06-09 DIAGNOSIS — E559 Vitamin D deficiency, unspecified: Secondary | ICD-10-CM | POA: Diagnosis not present

## 2022-06-09 DIAGNOSIS — E538 Deficiency of other specified B group vitamins: Secondary | ICD-10-CM | POA: Diagnosis not present

## 2022-06-09 DIAGNOSIS — F4321 Adjustment disorder with depressed mood: Secondary | ICD-10-CM

## 2022-06-09 DIAGNOSIS — J4540 Moderate persistent asthma, uncomplicated: Secondary | ICD-10-CM

## 2022-06-09 DIAGNOSIS — R7303 Prediabetes: Secondary | ICD-10-CM

## 2022-06-09 DIAGNOSIS — Z6841 Body Mass Index (BMI) 40.0 and over, adult: Secondary | ICD-10-CM

## 2022-06-09 DIAGNOSIS — E669 Obesity, unspecified: Secondary | ICD-10-CM

## 2022-06-09 DIAGNOSIS — Z9189 Other specified personal risk factors, not elsewhere classified: Secondary | ICD-10-CM

## 2022-06-09 MED ORDER — VITAMIN D (ERGOCALCIFEROL) 1.25 MG (50000 UNIT) PO CAPS: ORAL_CAPSULE | ORAL | 0 refills | Status: DC

## 2022-06-09 MED ORDER — BUPROPION HCL ER (XL) 150 MG PO TB24: ORAL_TABLET | ORAL | 0 refills | Status: DC

## 2022-06-10 LAB — VITAMIN D 25 HYDROXY (VIT D DEFICIENCY, FRACTURES): Vit D, 25-Hydroxy: 21.2 ng/mL — ABNORMAL LOW (ref 30.0–100.0)

## 2022-06-10 LAB — HEMOGLOBIN A1C
Est. average glucose Bld gHb Est-mCnc: 123 mg/dL
Hgb A1c MFr Bld: 5.9 % — ABNORMAL HIGH (ref 4.8–5.6)

## 2022-06-10 LAB — VITAMIN B12: Vitamin B-12: 905 pg/mL (ref 232–1245)

## 2022-06-10 MED ORDER — ALBUTEROL SULFATE HFA 108 (90 BASE) MCG/ACT IN AERS: 2.0000 | INHALATION_SPRAY | RESPIRATORY_TRACT | 0 refills | Status: DC | PRN

## 2022-06-19 NOTE — Progress Notes (Signed)
Chief Complaint:   OBESITY Melanie Blanchard is here to discuss her progress with her obesity treatment plan along with follow-up of her obesity related diagnoses. Melanie Blanchard is on the Vegetarian Plan and states she is following her eating plan approximately 45% of the time. Melanie Blanchard states she is not currently exercising.  Today's visit was #: 8 Starting weight: 297 lbs Starting date: 01/05/2022 Today's weight: 313 lbs Today's date: 06/09/2022 Total lbs lost to date: 0 Total lbs lost since last in-office visit: +14  Interim History: Melanie Blanchard is struggling because her mom is in [place], and she's been stress eating and gained 14 lbs. Pt skips meals and then stress eats junk foods/snacks.  Subjective:   1. Pre-diabetes Pt has had no rice/carb cravings.  2. Adjustment disorder with depressed mood Melanie Blanchard is taking Wellbutrin daily.  3. B12 deficiency She has been on and off of her OTC B12 500 mcg supplement.  4. Vitamin D deficiency Vit D level was 13.6 on last check. She has been on Ergocalciferol for 4 months now.   5. At risk for depression Melanie Blanchard is at elevated risk of depression due to one or more of the following: family history, significant life stressors, medical conditions and/or poor nutrition.  Assessment/Plan:   Orders Placed This Encounter  Procedures   VITAMIN D 25 Hydroxy (Vit-D Deficiency, Fractures)   Hemoglobin A1c   Vitamin B12    Medications Discontinued During This Encounter  Medication Reason   buPROPion (WELLBUTRIN XL) 150 MG 24 hr tablet Reorder   Vitamin D, Ergocalciferol, (DRISDOL) 1.25 MG (50000 UNIT) CAPS capsule Reorder     Meds ordered this encounter  Medications   buPROPion (WELLBUTRIN XL) 150 MG 24 hr tablet    Sig: 2 po q am    Dispense:  60 tablet    Refill:  0   Vitamin D, Ergocalciferol, (DRISDOL) 1.25 MG (50000 UNIT) CAPS capsule    Sig: 1 on wed and 1 po on sun    Dispense:  8 capsule    Refill:  0     1. Pre-diabetes Melanie Blanchard  will continue to work on weight loss, exercise, and decreasing simple carbohydrates to help decrease the risk of diabetes.  Her A1c was previously 5.8.  Lab/Orders today or future: - Hemoglobin A1c  2. Adjustment disorder with depressed mood Behavior modification techniques were discussed today to help Melanie Blanchard deal with her emotional/non-hunger eating behaviors.  Orders and follow up as documented in patient record.   Increase & Refill- buPROPion (WELLBUTRIN XL) 150 MG 24 hr tablet; 2 po q am  Dispense: 60 tablet; Refill: 0  3. B12 deficiency The diagnosis was reviewed with the patient. Counseling provided today, see below. We will continue to monitor. Orders and follow up as documented in patient record. Continue OTC B12 supplement.  Counseling The body needs vitamin B12: to make red blood cells; to make DNA; and to help the nerves work properly so they can carry messages from the brain to the body.  The main causes of vitamin B12 deficiency include dietary deficiency, digestive diseases, pernicious anemia, and having a surgery in which part of the stomach or small intestine is removed.  Certain medicines can make it harder for the body to absorb vitamin B12. These medicines include: heartburn medications; some antibiotics; some medications used to treat diabetes, gout, and high cholesterol.  In some cases, there are no symptoms of this condition. If the condition leads to anemia or nerve damage, various symptoms can  occur, such as weakness or fatigue, shortness of breath, and numbness or tingling in your hands and feet.   Treatment:  May include taking vitamin B12 supplements.  Avoid alcohol.  Eat lots of healthy foods that contain vitamin B12: Beef, pork, chicken, [place], and organ meats, such as liver.  Seafood: This includes clams, rainbow trout, salmon, tuna, and haddock. Eggs.  Cereal and dairy products that are fortified: This means that vitamin B12 has been added to the food.   Lab/Orders today or future: - Vitamin B12  4. Vitamin D deficiency Low Vitamin D level contributes to fatigue and are associated with obesity, breast, and colon cancer. She agrees to continue to take prescription Vitamin D 50,000 IU twice a week and will follow-up for routine testing of Vitamin D, at least 2-3 times per year to avoid over-replacement.  Refill- Vitamin D, Ergocalciferol, (DRISDOL) 1.25 MG (50000 UNIT) CAPS capsule; 1 on wed and 1 po on sun  Dispense: 8 capsule; Refill: 0  Lab/Orders today or future: - VITAMIN D 25 Hydroxy (Vit-D Deficiency, Fractures)  5. At risk for depression Melanie Blanchard was given approximately 10 minutes of depression prevention counseling today due to their higher than average risk for this condition. The patient has several risk factors for depression such as chronic medical conditions, sleep issues, major life stressors/events, etc., and we discussed these today.  Melanie Blanchard was also counseled on the importance of a healthy work-life balance, a healthy relationship with food, and a good support system.  We discussed various strategies to help cope with these emotions as well.  I recommended counseling, meditation or prayer, healthy eating habits, sleep hygiene, and exercising to help manage these feelings.   6. Obesity, current BMI 59.2 Melanie Blanchard is currently in the action stage of change. As such, her goal is to continue with weight loss efforts. She has agreed to the Category 2 Plan.   Consider changing lunch and dinner and eat largest meal mid-day.  Exercise goals:  Walk for 10-15 minutes 3 days a week.  Behavioral modification strategies: no skipping meals.  Melanie Blanchard has agreed to follow-up with our clinic in 2 weeks. She was informed of the importance of frequent follow-up visits to maximize her success with intensive lifestyle modifications for her multiple health conditions.   Objective:   Blood pressure  120/79, pulse 95, temperature 98.3 F (36.8 C), height 5\' 1"  (1.549 m), weight (!) 313 lb (142 kg), SpO2 98 %. Body mass index is 59.14 kg/m.  General: Cooperative, alert, well developed, in no acute distress. HEENT: Conjunctivae and lids unremarkable. Cardiovascular: Regular rhythm.  Lungs: Normal work of breathing. Neurologic: No focal deficits.   Lab Results  Component Value Date   CREATININE 0.96 01/05/2022   BUN 14 01/05/2022   NA 140 01/05/2022   K 4.5 01/05/2022   CL 103 01/05/2022   CO2 22 01/05/2022   Lab Results  Component Value Date   ALT 16 01/05/2022   AST 20 01/05/2022   ALKPHOS 83 01/05/2022   BILITOT 0.4 01/05/2022   Lab Results  Component Value Date   HGBA1C 5.9 (H) 06/09/2022   HGBA1C 5.8 (H) 01/05/2022   HGBA1C 5.8 10/29/2021   HGBA1C 5.9 06/09/2017   HGBA1C 5.4 06/21/2013   Lab Results  Component Value Date   INSULIN 23.2 01/05/2022   Lab Results  Component Value Date   TSH 1.480 01/05/2022   Lab Results  Component Value Date   CHOL 204 (H)  01/05/2022   HDL 62 01/05/2022   LDLCALC 119 (H) 01/05/2022   LDLDIRECT 65.0 06/09/2017   TRIG 129 (H) 01/05/2022   CHOLHDL 3.3 01/05/2022   Lab Results  Component Value Date   VD25OH 21.2 (L) 06/09/2022   VD25OH 13.6 (L) 01/05/2022   VD25OH 12.31 (L) 10/29/2021   Lab Results  Component Value Date   WBC 5.4 01/05/2022   HGB 12.6 01/05/2022   HCT 39.4 01/05/2022   MCV 81 01/05/2022   PLT 413 01/05/2022    Attestation Statements:   Reviewed by clinician on day of visit: allergies, medications, problem list, medical history, surgical history, family history, social history, and previous encounter notes.  I, Kyung Rudd, BS, CMA, am acting as transcriptionist for [employer], DO.   I have reviewed the above documentation for accuracy and completeness, and I agree with the above. Carlye Grippe, D.O.  The 21st Century Cures Act was signed into law in 2016 which includes the topic  of electronic health records.  This provides immediate access to information in MyChart.  This includes consultation notes, operative notes, office notes, lab results and pathology reports.  If you have any questions about what you read please let us know at your next visit so we can discuss your concerns and take corrective action if need be.  We are right here with you.

## 2022-07-01 ENCOUNTER — Telehealth (INDEPENDENT_AMBULATORY_CARE_PROVIDER_SITE_OTHER): Payer: Commercial Managed Care - HMO | Admitting: Family Medicine

## 2022-07-15 ENCOUNTER — Ambulatory Visit (INDEPENDENT_AMBULATORY_CARE_PROVIDER_SITE_OTHER): Payer: Commercial Managed Care - HMO | Admitting: Family Medicine

## 2022-10-21 ENCOUNTER — Ambulatory Visit (INDEPENDENT_AMBULATORY_CARE_PROVIDER_SITE_OTHER): Payer: Commercial Managed Care - HMO | Admitting: Family

## 2022-10-21 ENCOUNTER — Encounter: Payer: Self-pay | Admitting: Family

## 2022-10-21 VITALS — BP 110/56 | HR 100 | Temp 96.8°F | Ht 61.0 in | Wt 322.6 lb

## 2022-10-21 DIAGNOSIS — J101 Influenza due to other identified influenza virus with other respiratory manifestations: Secondary | ICD-10-CM | POA: Diagnosis not present

## 2022-10-21 LAB — POCT INFLUENZA A/B
Influenza A, POC: NEGATIVE
Influenza B, POC: POSITIVE — AB

## 2022-10-21 LAB — POC COVID19 BINAXNOW: SARS Coronavirus 2 Ag: NEGATIVE

## 2022-10-21 MED ORDER — OSELTAMIVIR PHOSPHATE 75 MG PO CAPS: 75.0000 mg | ORAL_CAPSULE | Freq: Two times a day (BID) | ORAL | 0 refills | Status: DC

## 2022-10-21 NOTE — Progress Notes (Signed)
Patient ID: Sumeya Sise, female    DOB: December 18, 2000, 22 y.o.   MRN: [id number]  Chief Complaint  Patient presents with   Cough    Pt states sx started 2 days ago, covid, flu ?    Nasal Congestion    HPI:      Flu-like sx:  sx for 2d, nasal congestion, headache, dry cough, denies body aches, fever. Pt has not had a covid booster or flu shot this year. Reports having flu in past. Has been taking Alka seltzer with mild relief.      Assessment & Plan:  1. Influenza B rapid covid neg. sending Tamiflu, advised on use & SE, Continue OTC sinus/cold medications including generic Sudafed, Claritin D, or Mucinex per box directions, Ibuprofen up to 621m or generic Tylenol 1,0070mevery 6 hours. Use nasal saline spray tid, humidifier overnight. Increase water intake to at least 2 liters qd.   - POCT Influenza A/B - POC COVID-19 - oseltamivir (TAMIFLU) 75 MG capsule; Take 1 capsule (75 mg total) by mouth 2 (two) times daily.  Dispense: 10 capsule; Refill: 0   Subjective:    Outpatient Medications Prior to Visit  Medication Sig Dispense Refill   albuterol (PROAIR HFA) 108 (90 Base) MCG/ACT inhaler Inhale 2 puffs into the lungs every 4 (four) hours as needed for wheezing. 2 each 0   buPROPion (WELLBUTRIN XL) 150 MG 24 hr tablet 2 po q am 60 tablet 0   drospirenone-ethinyl estradiol (YAZ) 3-0.02 MG tablet Take 1 tablet by mouth daily. 28 tablet 11   melatonin 3 MG TABS tablet Take 1 tablet (3 mg total) by mouth at bedtime.  0   vitamin B-12 (CYANOCOBALAMIN) 500 MCG tablet Take 1 tablet (500 mcg total) by mouth daily. Over the counter     Vitamin D, Ergocalciferol, (DRISDOL) 1.25 MG (50000 UNIT) CAPS capsule 1 on wed and 1 po on sun 8 capsule 0   No facility-administered medications prior to visit.   Past Medical History:  Diagnosis Date   Asthma    Fatigue    Obesity    SOB (shortness of breath) on exertion    History reviewed. No pertinent surgical history. No Known  Allergies    Objective:    Physical Exam Vitals and nursing note reviewed.  Constitutional:      Appearance: Normal appearance. She is ill-appearing.     Interventions: Face mask in place.  HENT:     Right Ear: Tympanic membrane and ear canal normal.     Left Ear: Tympanic membrane and ear canal normal.     Nose:     Right Sinus: Frontal sinus tenderness present.     Left Sinus: Frontal sinus tenderness present.     Mouth/Throat:     Mouth: Mucous membranes are moist.     Pharynx: Posterior oropharyngeal erythema present. No pharyngeal swelling, oropharyngeal exudate or uvula swelling.     Tonsils: No tonsillar exudate or tonsillar abscesses.  Cardiovascular:     Rate and Rhythm: Normal rate and regular rhythm.  Pulmonary:     Effort: Pulmonary effort is normal.     Breath sounds: Normal breath sounds.  Musculoskeletal:        General: Normal range of motion.  Lymphadenopathy:     Head:     Right side of head: No preauricular or posterior auricular adenopathy.     Left side of head: No preauricular or posterior auricular adenopathy.     Cervical: No  cervical adenopathy.  Skin:    General: Skin is warm and dry.  Neurological:     Mental Status: She is alert.  Psychiatric:        Mood and Affect: Mood normal.        Behavior: Behavior normal.    BP (!) 110/56   Pulse 100   Temp (!) 96.8 F (36 C) (Temporal)   Ht 5' 1"$  (1.549 m)   Wt (!) 322 lb 9.6 oz (146.3 kg)   LMP 10/16/2022 (Exact Date)   SpO2 95%   BMI 60.95 kg/m  Wt Readings from Last 3 Encounters:  10/21/22 (!) 322 lb 9.6 oz (146.3 kg) (>99 %, Z= 2.94)*  06/09/22 (!) 313 lb (142 kg) (>99 %, Z= 2.86)*  05/13/22 299 lb (135.6 kg) (>99 %, Z= 2.78)*   * Growth percentiles are based on CDC (Girls, 2-20 Years) data.      Jeanie Sewer, NP

## 2023-02-24 ENCOUNTER — Telehealth: Payer: Self-pay

## 2023-02-24 NOTE — Telephone Encounter (Signed)
LVM for patient to call back [phone], or to call PCP office to schedule follow up apt. AS, CMA  

## 2023-03-04 ENCOUNTER — Ambulatory Visit (INDEPENDENT_AMBULATORY_CARE_PROVIDER_SITE_OTHER): Payer: Medicaid Other | Admitting: Family Medicine

## 2023-03-04 VITALS — BP 116/84 | HR 82 | Temp 98.6°F | Wt 316.2 lb

## 2023-03-04 DIAGNOSIS — N926 Irregular menstruation, unspecified: Secondary | ICD-10-CM

## 2023-03-04 DIAGNOSIS — Z6841 Body Mass Index (BMI) 40.0 and over, adult: Secondary | ICD-10-CM | POA: Diagnosis not present

## 2023-03-04 DIAGNOSIS — R5383 Other fatigue: Secondary | ICD-10-CM | POA: Diagnosis not present

## 2023-03-04 LAB — VITAMIN D 25 HYDROXY (VIT D DEFICIENCY, FRACTURES): VITD: 15.94 ng/mL — ABNORMAL LOW (ref 30.00–100.00)

## 2023-03-04 LAB — COMPREHENSIVE METABOLIC PANEL
ALT: 16 U/L (ref 0–35)
AST: 23 U/L (ref 0–37)
Albumin: 3.9 g/dL (ref 3.5–5.2)
Alkaline Phosphatase: 70 U/L (ref 47–119)
BUN: 13 mg/dL (ref 6–23)
CO2: 24 mEq/L (ref 19–32)
Calcium: 9.2 mg/dL (ref 8.4–10.5)
Chloride: 105 mEq/L (ref 96–112)
Creatinine, Ser: 0.91 mg/dL (ref 0.40–1.20)
GFR: 91.19 mL/min (ref >60.00)
Glucose, Bld: 90 mg/dL (ref 70–99)
Potassium: 3.8 mEq/L (ref 3.5–5.1)
Sodium: 137 mEq/L (ref 135–145)
Total Bilirubin: 0.4 mg/dL (ref 0.2–1.2)
Total Protein: 7.2 g/dL (ref 6.0–8.3)

## 2023-03-04 LAB — CBC WITH DIFFERENTIAL/PLATELET
Basophils Absolute: 0 10*3/uL (ref 0.0–0.1)
Basophils Relative: 0.5 % (ref 0.0–3.0)
Eosinophils Absolute: 0.1 10*3/uL (ref 0.0–0.7)
Eosinophils Relative: 1.1 % (ref 0.0–5.0)
HCT: 37.2 % (ref 36.0–49.0)
Hemoglobin: 11.8 g/dL — ABNORMAL LOW (ref 12.0–16.0)
Lymphocytes Relative: 43.4 % (ref 24.0–48.0)
Lymphs Abs: 2.5 10*3/uL (ref 0.7–4.0)
MCHC: 31.6 g/dL (ref 31.0–37.0)
MCV: 79.5 fl (ref 78.0–98.0)
Monocytes Absolute: 0.4 10*3/uL (ref 0.1–1.0)
Monocytes Relative: 7.4 % (ref 3.0–12.0)
Neutro Abs: 2.7 10*3/uL (ref 1.4–7.7)
Neutrophils Relative %: 47.6 % (ref 43.0–71.0)
Platelets: 348 10*3/uL (ref 150.0–575.0)
RBC: 4.68 Mil/uL (ref 3.80–5.70)
RDW: 15.6 % — ABNORMAL HIGH (ref 11.4–15.5)
WBC: 5.7 10*3/uL (ref 4.5–13.5)

## 2023-03-04 LAB — T4, FREE: Free T4: 1.16 ng/dL (ref 0.60–1.60)

## 2023-03-04 LAB — POCT URINE PREGNANCY: Preg Test, Ur: NEGATIVE

## 2023-03-04 LAB — TSH: TSH: 2.33 u[IU]/mL (ref 0.40–5.00)

## 2023-03-04 NOTE — Progress Notes (Signed)
Established Patient Office Visit   Subjective  Patient ID: Melanie Blanchard, female    DOB: [birth year]  Age: 22 y.o. MRN: [id number]  Chief Complaint  Patient presents with   Results    Took test on tues and came back negative. Has been feeling sick and out of breath.     Patient is a 22 year old female presents for acute concern. Pt concerned she may be pregnant.  States LMP was 02/07/2023.  Typically occur q 21 days.  Had a negative at home pregnancy test 2 days ago.  Endorses unprotected sex.  Notes increased tiredness, SOB, nausea, and L breast soreness that started this wk. 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  Denies emesis, CP, LE edema, vag d/c.  Pt has been told she snores.      Past Medical History:  Diagnosis Date   Asthma    Fatigue    Obesity    SOB (shortness of breath) on exertion    History reviewed. No pertinent surgical history. Social History   Tobacco Use   Smoking status: Never   Smokeless tobacco: Never   Tobacco comments:    no smoking in home  Vaping Use   Vaping Use: Never used  Substance Use Topics   Alcohol use: No   Drug use: No   Family History  Problem Relation Age of Onset   Hypertension Mother    Sleep apnea Mother    Obesity Mother    Hyperlipidemia Father    Cancer Sister 29       Hodgkins lymphoma   Diabetes Maternal Grandfather    Hyperlipidemia Maternal Grandfather    Hypertension Maternal Grandfather    No Known Allergies    ROS Negative unless stated above    Objective:     BP 116/84 (BP Location: Left Arm, Patient Position: Sitting, Cuff Size: Large)   Pulse 82   Temp 98.6 F (37 C) (Oral)   Wt (!) 316 lb 3.2 oz (143.4 kg)   LMP 02/07/2023 (Approximate)   SpO2 96%   BMI 59.75 kg/m  BP Readings from Last 3 Encounters:  03/04/23 116/84  10/21/22 (!) 110/56  06/09/22 120/79   Wt Readings from Last 3 Encounters:  03/04/23 (!) 316 lb 3.2 oz (143.4 kg) (>99 %,  Z= 2.95)*  10/21/22 (!) 322 lb 9.6 oz (146.3 kg) (>99 %, Z= 2.94)*  06/09/22 (!) 313 lb (142 kg) (>99 %, Z= 2.86)*   * Growth percentiles are based on CDC (Girls, 2-20 Years) data.      Physical Exam Constitutional:      General: She is not in acute distress.    Appearance: Normal appearance. She is obese.  HENT:     Head: Normocephalic and atraumatic.     Nose: Nose normal.     Mouth/Throat:     Mouth: Mucous membranes are moist.  Cardiovascular:     Rate and Rhythm: Normal rate and regular rhythm.     Heart sounds: Normal heart sounds. No murmur heard.    No gallop.  Pulmonary:     Effort: Pulmonary effort is normal. No respiratory distress.     Breath sounds: Normal breath sounds. No wheezing, rhonchi or rales.  Skin:    General: Skin is warm and dry.  Neurological:     Mental Status: She is alert and oriented to person, place, and time.      Results for orders placed or performed in visit  on 03/04/23  POCT urine pregnancy  Result Value Ref Range   Preg Test, Ur Negative Negative      Assessment & Plan:  Late menses -     POCT urine pregnancy -     TSH -     T4, free -     VITAMIN D 25 Hydroxy (Vit-D Deficiency, Fractures)  Morbid obesity (HCC) -Body mass index is 59.75 kg/m. -Lifestyle modifications encouraged -     TSH -     T4, free -     VITAMIN D 25 Hydroxy (Vit-D Deficiency, Fractures) -     CBC with Differential/Platelet -     Comprehensive metabolic panel  Fatigue, unspecified type -     TSH -     T4, free -     VITAMIN D 25 Hydroxy (Vit-D Deficiency, Fractures) -     CBC with Differential/Platelet -     Comprehensive metabolic panel  Urine hCG negative in clinic.  Discussed possible causes of late menses.  Will have patient repeat at home test pt being next few days.  Will obtain labs to evaluate for other causes of symptoms.  Safe sex practices and contraception discussed.  No follow-ups on file.   Deeann Saint, MD

## 2023-03-07 ENCOUNTER — Encounter: Payer: Self-pay | Admitting: Family Medicine

## 2023-03-07 ENCOUNTER — Ambulatory Visit (INDEPENDENT_AMBULATORY_CARE_PROVIDER_SITE_OTHER): Payer: Medicaid Other | Admitting: Family Medicine

## 2023-03-07 VITALS — BP 120/84 | HR 73 | Temp 97.6°F | Ht 62.25 in | Wt 313.4 lb

## 2023-03-07 DIAGNOSIS — Z113 Encounter for screening for infections with a predominantly sexual mode of transmission: Secondary | ICD-10-CM

## 2023-03-07 DIAGNOSIS — Z6841 Body Mass Index (BMI) 40.0 and over, adult: Secondary | ICD-10-CM

## 2023-03-07 DIAGNOSIS — Z0000 Encounter for general adult medical examination without abnormal findings: Secondary | ICD-10-CM

## 2023-03-07 DIAGNOSIS — N939 Abnormal uterine and vaginal bleeding, unspecified: Secondary | ICD-10-CM

## 2023-03-07 DIAGNOSIS — N926 Irregular menstruation, unspecified: Secondary | ICD-10-CM | POA: Diagnosis not present

## 2023-03-07 DIAGNOSIS — E049 Nontoxic goiter, unspecified: Secondary | ICD-10-CM

## 2023-03-07 DIAGNOSIS — L83 Acanthosis nigricans: Secondary | ICD-10-CM

## 2023-03-07 DIAGNOSIS — N920 Excessive and frequent menstruation with regular cycle: Secondary | ICD-10-CM | POA: Diagnosis not present

## 2023-03-07 LAB — CBC WITH DIFFERENTIAL/PLATELET
Basophils Absolute: 0 10*3/uL (ref 0.0–0.1)
Basophils Relative: 0.3 % (ref 0.0–3.0)
Eosinophils Absolute: 0 10*3/uL (ref 0.0–0.7)
Eosinophils Relative: 0.6 % (ref 0.0–5.0)
HCT: 39.7 % (ref 36.0–49.0)
Hemoglobin: 12.5 g/dL (ref 12.0–16.0)
Lymphocytes Relative: 49.6 % — ABNORMAL HIGH (ref 24.0–48.0)
Lymphs Abs: 2.3 10*3/uL (ref 0.7–4.0)
MCHC: 31.4 g/dL (ref 31.0–37.0)
MCV: 79.2 fl (ref 78.0–98.0)
Monocytes Absolute: 0.4 10*3/uL (ref 0.1–1.0)
Monocytes Relative: 7.7 % (ref 3.0–12.0)
Neutro Abs: 1.9 10*3/uL (ref 1.4–7.7)
Neutrophils Relative %: 41.8 % — ABNORMAL LOW (ref 43.0–71.0)
Platelets: 372 10*3/uL (ref 150.0–575.0)
RBC: 5.01 Mil/uL (ref 3.80–5.70)
RDW: 15.3 % (ref 11.4–15.5)
WBC: 4.6 10*3/uL (ref 4.5–13.5)

## 2023-03-07 LAB — COMPREHENSIVE METABOLIC PANEL
ALT: 16 U/L (ref 0–35)
AST: 23 U/L (ref 0–37)
Albumin: 4.1 g/dL (ref 3.5–5.2)
Alkaline Phosphatase: 72 U/L (ref 47–119)
BUN: 9 mg/dL (ref 6–23)
CO2: 26 mEq/L (ref 19–32)
Calcium: 9.6 mg/dL (ref 8.4–10.5)
Chloride: 103 mEq/L (ref 96–112)
Creatinine, Ser: 0.98 mg/dL (ref 0.40–1.20)
GFR: 83.42 mL/min (ref >60.00)
Glucose, Bld: 91 mg/dL (ref 70–99)
Potassium: 3.9 mEq/L (ref 3.5–5.1)
Sodium: 138 mEq/L (ref 135–145)
Total Bilirubin: 0.4 mg/dL (ref 0.2–1.2)
Total Protein: 7.7 g/dL (ref 6.0–8.3)

## 2023-03-07 LAB — LIPID PANEL
Cholesterol: 153 mg/dL (ref 0–200)
HDL: 48.7 mg/dL (ref >39.00)
LDL Cholesterol: 84 mg/dL (ref 0–99)
NonHDL: 104.33
Total CHOL/HDL Ratio: 3
Triglycerides: 102 mg/dL (ref 0.0–149.0)
VLDL: 20.4 mg/dL (ref 0.0–40.0)

## 2023-03-07 LAB — HEMOGLOBIN A1C: Hgb A1c MFr Bld: 5.7 % (ref 4.6–6.5)

## 2023-03-07 LAB — POCT URINE PREGNANCY: Preg Test, Ur: NEGATIVE

## 2023-03-07 LAB — HCG, QUANTITATIVE, PREGNANCY: Quantitative HCG: <0.6 m[IU]/mL

## 2023-03-07 LAB — T4, FREE: Free T4: 0.88 ng/dL (ref 0.60–1.60)

## 2023-03-07 LAB — TSH: TSH: 1.31 u[IU]/mL (ref 0.40–5.00)

## 2023-03-07 NOTE — Progress Notes (Signed)
Established Patient Office Visit   Subjective  Patient ID: Melanie Blanchard, female    DOB: 08-May-2001  Age: 22 y.o. MRN: [id number]  Chief Complaint  Patient presents with   Annual Exam    Patient is a 22 year old who presents for CPE and follow-up on previous concern.  Patient seen last week for concerns of missed menses.  Urine hCG home testing and in clinic were negative.  Patient endorses spotting starting yesterday and lower abdominal cramping.  Cramping slightly different from typical menstrual cramping, may occur on left or right side of lower abdomen.  Minimal blood noted on light tampon when changed.  Patient has not repeated home pregnancy test.  Still having some SOB.    Past Medical History:  Diagnosis Date   Asthma    Fatigue    Obesity    SOB (shortness of breath) on exertion    History reviewed. No pertinent surgical history. Social History   Tobacco Use   Smoking status: Never   Smokeless tobacco: Never   Tobacco comments:    no smoking in home  Vaping Use   Vaping Use: Never used  Substance Use Topics   Alcohol use: No   Drug use: No   Family History  Problem Relation Age of Onset   Hypertension Mother    Sleep apnea Mother    Obesity Mother    Hyperlipidemia Father    Cancer Sister 70       Hodgkins lymphoma   Diabetes Maternal Grandfather    Hyperlipidemia Maternal Grandfather    Hypertension Maternal Grandfather    No Known Allergies    ROS Negative unless stated above    Objective:     BP 120/84 (BP Location: Left Arm, Patient Position: Sitting, Cuff Size: Large)   Pulse 73   Temp 97.6 F (36.4 C) (Oral)   Ht 5' 2.25" (1.581 m)   Wt (!) 313 lb 6.4 oz (142.2 kg)   LMP 03/06/2023 (Approximate) Comment: spotting  SpO2 97%   BMI 56.86 kg/m    Physical Exam Constitutional:      Appearance: Normal appearance.  HENT:     Head: Normocephalic and atraumatic.     Right Ear: Tympanic membrane, ear canal and external  ear normal.     Left Ear: Tympanic membrane, ear canal and external ear normal.     Nose: Nose normal.     Mouth/Throat:     Mouth: Mucous membranes are moist.     Pharynx: No oropharyngeal exudate or posterior oropharyngeal erythema.  Eyes:     General: No scleral icterus.    Extraocular Movements: Extraocular movements intact.     Conjunctiva/sclera: Conjunctivae normal.     Pupils: Pupils are equal, round, and reactive to light.  Neck:     Thyroid: Thyromegaly present.  Cardiovascular:     Rate and Rhythm: Normal rate and regular rhythm.     Pulses: Normal pulses.     Heart sounds: Normal heart sounds. No murmur heard.    No friction rub.  Pulmonary:     Effort: Pulmonary effort is normal.     Breath sounds: Normal breath sounds. No wheezing, rhonchi or rales.  Abdominal:     General: Bowel sounds are normal.     Palpations: Abdomen is soft.     Tenderness: There is no abdominal tenderness.  Musculoskeletal:        General: No deformity. Normal range of motion.  Lymphadenopathy:     Cervical:  No cervical adenopathy.  Skin:    General: Skin is warm and dry.     Findings: No lesion.     Comments: Acanthosis nigricans of neck.  Neurological:     General: No focal deficit present.     Mental Status: She is alert and oriented to person, place, and time.  Psychiatric:        Mood and Affect: Mood normal.        Thought Content: Thought content normal.      Results for orders placed or performed in visit on 03/07/23  POCT urine pregnancy  Result Value Ref Range   Preg Test, Ur Negative Negative      Assessment & Plan:  Well adult exam -     CBC with Differential/Platelet  Spotting -     CBC with Differential/Platelet -     TSH -     T4, free -     hCG, quantitative, pregnancy  Routine screening for STI (sexually transmitted infection) -     HIV Antibody (routine testing w rflx) -     RPR -     C. trachomatis/N. gonorrhoeae RNA -     Hepatitis C  antibody  Morbid obesity (HCC) -Body mass index is 56.86 kg/m. -Lifestyle modifications encouraged -Deconditioning likely contributing to SOB.  Discussed other possible causes -     TSH -     T4, free -     Hemoglobin A1c -     Lipid panel -     Comprehensive metabolic panel  Irregular menses -     CBC with Differential/Platelet -     TSH -     T4, free -     POCT urine pregnancy -     hCG, quantitative, pregnancy  Goiter -Stable -     TSH -     T4, free  Acanthosis nigricans -     Hemoglobin A1c  Age-appropriate health screenings discussed.  Will obtain labs.  Immunizations reviewed.  Obtain urine hCG and other labs to further evaluate irregular menses and rule out pregnancy.  Given precautions.  Next CPE in 1 year.  Return if symptoms worsen or fail to improve.   Deeann Saint, MD

## 2023-03-08 LAB — HIV ANTIBODY (ROUTINE TESTING W REFLEX): HIV 1&2 Ab, 4th Generation: NONREACTIVE

## 2023-03-08 LAB — HEPATITIS C ANTIBODY: Hepatitis C Ab: NONREACTIVE

## 2023-03-08 LAB — C. TRACHOMATIS/N. GONORRHOEAE RNA
C. trachomatis RNA, TMA: NOT DETECTED
N. gonorrhoeae RNA, TMA: NOT DETECTED

## 2023-03-08 LAB — RPR: RPR Ser Ql: NONREACTIVE

## 2023-06-22 ENCOUNTER — Encounter: Payer: Self-pay | Admitting: Family Medicine

## 2023-06-22 ENCOUNTER — Ambulatory Visit: Payer: Medicaid Other | Admitting: Family Medicine

## 2023-06-22 VITALS — BP 120/90 | HR 100 | Temp 98.8°F | Ht 62.25 in | Wt 314.4 lb

## 2023-06-22 DIAGNOSIS — F419 Anxiety disorder, unspecified: Secondary | ICD-10-CM

## 2023-06-22 DIAGNOSIS — F321 Major depressive disorder, single episode, moderate: Secondary | ICD-10-CM

## 2023-06-22 DIAGNOSIS — Z30011 Encounter for initial prescription of contraceptive pills: Secondary | ICD-10-CM | POA: Diagnosis not present

## 2023-06-22 DIAGNOSIS — Z113 Encounter for screening for infections with a predominantly sexual mode of transmission: Secondary | ICD-10-CM

## 2023-06-22 LAB — T4, FREE: Free T4: 0.9 ng/dL (ref 0.60–1.60)

## 2023-06-22 LAB — VITAMIN D 25 HYDROXY (VIT D DEFICIENCY, FRACTURES): VITD: 12.92 ng/mL — ABNORMAL LOW (ref 30.00–100.00)

## 2023-06-22 LAB — TSH: TSH: 1.73 u[IU]/mL (ref 0.35–5.50)

## 2023-06-22 MED ORDER — DROSPIRENONE-ETHINYL ESTRADIOL 3-0.02 MG PO TABS
1.0000 | ORAL_TABLET | Freq: Every day | ORAL | 11 refills | Status: DC
Start: 2023-06-22 — End: 2024-01-05

## 2023-06-22 MED ORDER — BUPROPION HCL ER (XL) 150 MG PO TB24
150.0000 mg | ORAL_TABLET | Freq: Every day | ORAL | 2 refills | Status: DC
Start: 2023-06-22 — End: 2024-01-05

## 2023-06-22 NOTE — Patient Instructions (Signed)
Behavioral Health Services: -to make an appointment contact the office/provider you are interested in seeing.  No referral is needed.  The below is not an all inclusive list, but will help you get started.  [email or web address] -counseling located off of [address]  [email or web address] -website helps you find providers in your area  [facility] -Located off of [address] across from [employer]  Dr. Akintayo is a Psychiatrist with [place]. [phone]  [facility]  [facility]  -[address]  [phone] -a place in town that has counseling and Psychiatry services.    

## 2023-06-22 NOTE — Progress Notes (Signed)
Established Patient Office Visit   Subjective  Patient ID: Melanie Blanchard, female    DOB: 12/02/2000  Age: 22 y.o. MRN: [id number]  Chief Complaint  Patient presents with   Medical Management of Chronic Issues    Patient came in today for labs and Birth control     Pt is a 22 yo female seen for f/u.  Pt states she has been less interested in activities she used to enjoy.  Notes changes in sleep.  At times mind races making it difficult to fall asleep.  Patient states her mom has noticed a change.  Patient is straight in counseling.  In the past was on medication for anxiety and depression prescribed by Dr. Val Eagle at weight management.  Patient states that was helpful.  Patient would like to restart birth control.  In the past was on Yaz OCPs.  Denies history of tobacco use, migraines with aura, DVT or PE.  Also interested in STI screening.  Denies discharge, irritation, or other symptoms.    Patient Active Problem List   Diagnosis Date Noted   Insomnia 05/13/2022   Adjustment disorder with depressed mood 04/22/2022   Pre-diabetes 02/08/2022   B12 deficiency 02/08/2022   Vitamin D deficiency 02/08/2022   Seasonal allergies 01/06/2018   Bug bite 06/21/2013   Eczema 12/08/2010   Obesity 11/03/2009   Asthma, moderate persistent, well-controlled 03/07/2008   Asthma, well controlled 03/07/2008   Allergic rhinitis 10/27/2006   Past Medical History:  Diagnosis Date   Asthma    Fatigue    Obesity    SOB (shortness of breath) on exertion    History reviewed. No pertinent surgical history. Social History   Tobacco Use   Smoking status: Never   Smokeless tobacco: Never   Tobacco comments:    no smoking in home  Vaping Use   Vaping status: Never Used  Substance Use Topics   Alcohol use: No   Drug use: No   Family History  Problem Relation Age of Onset   Hypertension Mother    Sleep apnea Mother    Obesity Mother    Hyperlipidemia Father    Cancer Sister 73        Hodgkins lymphoma   Diabetes Maternal Grandfather    Hyperlipidemia Maternal Grandfather    Hypertension Maternal Grandfather    No Known Allergies    ROS Negative unless stated above    Objective:     BP (!) 120/90 (BP Location: Left Arm, Patient Position: Sitting, Cuff Size: Large)   Pulse 100   Temp 98.8 F (37.1 C) (Oral)   Ht 5' 2.25" (1.581 m)   Wt (!) 314 lb 6.4 oz (142.6 kg)   LMP 05/26/2023 (Exact Date)   SpO2 95%   BMI 57.04 kg/m  BP Readings from Last 3 Encounters:  06/22/23 (!) 120/90  03/07/23 120/84  03/04/23 116/84   Wt Readings from Last 3 Encounters:  06/22/23 (!) 314 lb 6.4 oz (142.6 kg)  03/07/23 (!) 313 lb 6.4 oz (142.2 kg) (>99%, Z= 2.93)*  03/04/23 (!) 316 lb 3.2 oz (143.4 kg) (>99%, Z= 2.95)*   * Growth percentiles are based on CDC (Girls, 2-20 Years) data.      Physical Exam Constitutional:      General: She is not in acute distress.    Appearance: Normal appearance.  HENT:     Head: Normocephalic and atraumatic.     Nose: Nose normal.     Mouth/Throat:  Mouth: Mucous membranes are moist.  Eyes:     Extraocular Movements: Extraocular movements intact.     Conjunctiva/sclera: Conjunctivae normal.  Cardiovascular:     Rate and Rhythm: Normal rate and regular rhythm.     Heart sounds: Normal heart sounds. No murmur heard.    No gallop.  Pulmonary:     Effort: Pulmonary effort is normal. No respiratory distress.     Breath sounds: Normal breath sounds. No wheezing, rhonchi or rales.  Skin:    General: Skin is warm and dry.  Neurological:     Mental Status: She is alert and oriented to person, place, and time.        06/22/2023    2:10 PM 03/07/2023   10:32 AM 03/04/2023    8:44 AM  Depression screen PHQ 2/9  Decreased Interest 2 1 1   Down, Depressed, Hopeless 3 1 2   PHQ - 2 Score 5 2 3   Altered sleeping 3 1 1   Tired, decreased energy 2 1 3   Change in appetite 3 1 2   Feeling bad or failure about yourself  3 1 2    Trouble concentrating 2 1 1   Moving slowly or fidgety/restless 2 0 0  Suicidal thoughts 2 0 0  PHQ-9 Score 22 7 12   Difficult doing work/chores Very difficult Somewhat difficult Somewhat difficult      06/22/2023    2:11 PM 03/07/2023   10:32 AM 03/04/2023    8:45 AM  GAD 7 : Generalized Anxiety Score  Nervous, Anxious, on Edge 2 1 1   Control/stop worrying 2 1 1   Worry too much - different things 3 1 1   Trouble relaxing 3 1 0  Restless 2 0 0  Easily annoyed or irritable 3 1 1   Afraid - awful might happen 2 1 1   Total GAD 7 Score 17 6 5   Anxiety Difficulty  Somewhat difficult Somewhat difficult     No results found for any visits on 06/22/23.    Assessment & Plan:  Depression, major, single episode, moderate (HCC) -     TSH -     T4, free -     VITAMIN D 25 Hydroxy (Vit-D Deficiency, Fractures) -     buPROPion HCl ER (XL); Take 1 tablet (150 mg total) by mouth daily.  Dispense: 30 tablet; Refill: 2  Anxiety -     TSH -     T4, free -     buPROPion HCl ER (XL); Take 1 tablet (150 mg total) by mouth daily.  Dispense: 30 tablet; Refill: 2  Routine screening for STI (sexually transmitted infection) -     RPR -     HIV Antibody (routine testing w rflx) -     C. trachomatis/N. gonorrhoeae RNA  Encounter for initial prescription of contraceptive pills -     Drospirenone-Ethinyl Estradiol; Take 1 tablet by mouth daily.  Dispense: 28 tablet; Refill: 11  Morbid obesity (HCC) -Body mass index is 57.04 kg/m. -consider return to wt management.   -continue lifestyle modification.  Incorporate techniques learned in weight management.  New problem, increased depression and anxiety symptoms.  PHQ-9 score 22, GAD-7 score 17.  Obtain labs.  Discussed counseling options and medication.  Patient wishes to restart medication.  Rx for Wellbutrin XL 150 mg sent to pharmacy as previously well-tolerated.  Given info on area [facility] providers.  Self-care encouraged.  Obtain STI screening.  Start  Yaz OCPs.  Lifestyle modifications encouraged.  Return in about 6 weeks (around  08/03/2023).  Sooner if needed.  Deeann Saint, MD

## 2023-06-23 LAB — HIV ANTIBODY (ROUTINE TESTING W REFLEX): HIV 1&2 Ab, 4th Generation: NONREACTIVE

## 2023-06-23 LAB — C. TRACHOMATIS/N. GONORRHOEAE RNA
C. trachomatis RNA, TMA: NOT DETECTED
N. gonorrhoeae RNA, TMA: NOT DETECTED

## 2023-06-23 LAB — RPR: RPR Ser Ql: NONREACTIVE

## 2023-06-24 ENCOUNTER — Other Ambulatory Visit: Payer: Self-pay | Admitting: Family Medicine

## 2023-06-24 DIAGNOSIS — E559 Vitamin D deficiency, unspecified: Secondary | ICD-10-CM

## 2023-06-24 MED ORDER — VITAMIN D (ERGOCALCIFEROL) 1.25 MG (50000 UNIT) PO CAPS
50000.0000 [IU] | ORAL_CAPSULE | ORAL | 0 refills | Status: DC
Start: 2023-06-24 — End: 2023-11-16

## 2023-09-02 ENCOUNTER — Other Ambulatory Visit: Payer: Self-pay | Admitting: Family Medicine

## 2023-09-02 DIAGNOSIS — E559 Vitamin D deficiency, unspecified: Secondary | ICD-10-CM

## 2023-11-16 ENCOUNTER — Encounter: Payer: Self-pay | Admitting: Family Medicine

## 2023-11-16 ENCOUNTER — Ambulatory Visit: Admitting: Family Medicine

## 2023-11-16 ENCOUNTER — Encounter (INDEPENDENT_AMBULATORY_CARE_PROVIDER_SITE_OTHER): Payer: Self-pay

## 2023-11-16 VITALS — BP 130/74 | HR 112 | Temp 98.4°F | Ht 62.5 in | Wt 324.0 lb

## 2023-11-16 DIAGNOSIS — R7303 Prediabetes: Secondary | ICD-10-CM | POA: Diagnosis not present

## 2023-11-16 DIAGNOSIS — Z113 Encounter for screening for infections with a predominantly sexual mode of transmission: Secondary | ICD-10-CM

## 2023-11-16 DIAGNOSIS — H66001 Acute suppurative otitis media without spontaneous rupture of ear drum, right ear: Secondary | ICD-10-CM

## 2023-11-16 DIAGNOSIS — R030 Elevated blood-pressure reading, without diagnosis of hypertension: Secondary | ICD-10-CM | POA: Diagnosis not present

## 2023-11-16 MED ORDER — AMOXICILLIN-POT CLAVULANATE 500-125 MG PO TABS: 1.0000 | ORAL_TABLET | Freq: Two times a day (BID) | ORAL | 0 refills | Status: AC

## 2023-11-16 NOTE — Progress Notes (Unsigned)
 Established Patient Office Visit   Subjective  Patient ID: Melanie Blanchard, female    DOB: Dec 31, 2000  Age: 23 y.o. MRN: [id number]  Chief Complaint  Patient presents with  . Ear Pain  . Exposure to STD    Patient is a 23 year old female seen for ongoing concerns.  Patient endorses sharp brief chest pain in mid/left upper chest this morning while in shower.  Sensation lasted a few seconds then resolved.  Patient states prior to getting the shower she had a soft biscuit from [employer].  Patient mentions sensations occurred before in the past but she has never mentioned it.  Patient also notes having migraines for the last few weeks.  BP elevated this visit.  Patient with left ear feeling clogged/congested x 1.5 weeks.   Patient Active Problem List   Diagnosis Date Noted  . Insomnia 05/13/2022  . Adjustment disorder with depressed mood 04/22/2022  . Pre-diabetes 02/08/2022  . B12 deficiency 02/08/2022  . Vitamin D deficiency 02/08/2022  . Seasonal allergies 01/06/2018  . Bug bite 06/21/2013  . Eczema 12/08/2010  . Obesity 11/03/2009  . Asthma, moderate persistent, well-controlled 03/07/2008  . Asthma, well controlled 03/07/2008  . Allergic rhinitis 10/27/2006   Past Medical History:  Diagnosis Date  . Asthma   . Fatigue   . Obesity   . SOB (shortness of breath) on exertion    History reviewed. No pertinent surgical history. Social History   Tobacco Use  . Smoking status: Never  . Smokeless tobacco: Never  . Tobacco comments:    no smoking in home  Vaping Use  . Vaping status: Never Used  Substance Use Topics  . Alcohol use: No  . Drug use: No   Family History  Problem Relation Age of Onset  . Hypertension Mother   . Sleep apnea Mother   . Obesity Mother   . Hyperlipidemia Father   . Cancer Sister 52       Hodgkins lymphoma  . Diabetes Maternal Grandfather   . Hyperlipidemia Maternal Grandfather   . Hypertension Maternal Grandfather     No Known Allergies    ROS Negative unless stated above    Objective:     BP (!) 152/88 (BP Location: Right Arm, Patient Position: Sitting, Cuff Size: Normal)   Pulse (!) 112   Temp 98.4 F (36.9 C) (Oral)   Ht 5' 2.5" (1.588 m)   Wt (!) 324 lb (147 kg)   LMP 10/29/2023 (Exact Date)   SpO2 96%   BMI 58.32 kg/m  BP Readings from Last 3 Encounters:  11/16/23 (!) 152/88  06/22/23 (!) 120/90  03/07/23 120/84   Wt Readings from Last 3 Encounters:  11/16/23 (!) 324 lb (147 kg)  06/22/23 (!) 314 lb 6.4 oz (142.6 kg)  03/07/23 (!) 313 lb 6.4 oz (142.2 kg) (>99%, Z= 2.93)*   * Growth percentiles are based on CDC (Girls, 2-20 Years) data.      Physical Exam Constitutional:      General: She is not in acute distress.    Appearance: Normal appearance.  HENT:     Head: Normocephalic and atraumatic.     Right Ear: Hearing normal. Tympanic membrane is erythematous and bulging.     Left Ear: Hearing and tympanic membrane normal.     Nose: Nose normal.     Mouth/Throat:     Mouth: Mucous membranes are moist.  Cardiovascular:     Rate and Rhythm: Normal rate and regular rhythm.  Heart sounds: Normal heart sounds. No murmur heard.    No gallop.  Pulmonary:     Effort: Pulmonary effort is normal. No respiratory distress.     Breath sounds: Normal breath sounds. No wheezing, rhonchi or rales.  Musculoskeletal:     Comments: No TTP of chest wall.  Skin:    General: Skin is warm and dry.  Neurological:     Mental Status: She is alert and oriented to person, place, and time.     No results found for any visits on 11/16/23.    Assessment & Plan:  Acute suppurative otitis media of right ear without spontaneous rupture of tympanic membrane, recurrence not specified -     Amoxicillin-Pot Clavulanate; Take 1 tablet by mouth in the morning and at bedtime for 7 days.  Dispense: 14 tablet; Refill: 0  Pre-diabetes -     Hemoglobin A1c  Morbid obesity (HCC) -     Amb Ref  to [facility]  Routine screening for STI (sexually transmitted infection) -     RPR -     HIV Antibody (routine testing w rflx) -     C. trachomatis/N. gonorrhoeae RNA  Elevated blood pressure reading without diagnosis of hypertension -     Comprehensive metabolic panel -     TSH -     T4, free    No follow-ups on file.   Deeann Saint, MD

## 2023-11-17 ENCOUNTER — Encounter: Payer: Self-pay | Admitting: Family Medicine

## 2023-11-17 LAB — COMPREHENSIVE METABOLIC PANEL
ALT: 19 U/L (ref 0–35)
AST: 21 U/L (ref 0–37)
Albumin: 3.9 g/dL (ref 3.5–5.2)
Alkaline Phosphatase: 66 U/L (ref 39–117)
BUN: 13 mg/dL (ref 6–23)
CO2: 26 meq/L (ref 19–32)
Calcium: 9.4 mg/dL (ref 8.4–10.5)
Chloride: 102 meq/L (ref 96–112)
Creatinine, Ser: 0.97 mg/dL (ref 0.40–1.20)
GFR: 84.04 mL/min (ref >60.00)
Glucose, Bld: 89 mg/dL (ref 70–99)
Potassium: 4.1 meq/L (ref 3.5–5.1)
Sodium: 137 meq/L (ref 135–145)
Total Bilirubin: 0.3 mg/dL (ref 0.2–1.2)
Total Protein: 7.6 g/dL (ref 6.0–8.3)

## 2023-11-17 LAB — C. TRACHOMATIS/N. GONORRHOEAE RNA
C. trachomatis RNA, TMA: NOT DETECTED
N. gonorrhoeae RNA, TMA: NOT DETECTED

## 2023-11-17 LAB — T4, FREE: Free T4: 0.8 ng/dL (ref 0.60–1.60)

## 2023-11-17 LAB — HIV ANTIBODY (ROUTINE TESTING W REFLEX): HIV 1&2 Ab, 4th Generation: NONREACTIVE

## 2023-11-17 LAB — RPR: RPR Ser Ql: NONREACTIVE

## 2023-11-17 LAB — HEMOGLOBIN A1C: Hgb A1c MFr Bld: 6 % (ref 4.6–6.5)

## 2023-11-17 LAB — TSH: TSH: 2.19 u[IU]/mL (ref 0.35–5.50)

## 2024-01-05 ENCOUNTER — Other Ambulatory Visit (HOSPITAL_BASED_OUTPATIENT_CLINIC_OR_DEPARTMENT_OTHER): Payer: Self-pay

## 2024-01-05 ENCOUNTER — Ambulatory Visit: Admitting: Family Medicine

## 2024-01-05 ENCOUNTER — Encounter: Payer: Self-pay | Admitting: Family Medicine

## 2024-01-05 VITALS — BP 126/74 | HR 89 | Temp 98.3°F | Ht 62.5 in | Wt 332.2 lb

## 2024-01-05 DIAGNOSIS — Z30011 Encounter for initial prescription of contraceptive pills: Secondary | ICD-10-CM | POA: Diagnosis not present

## 2024-01-05 DIAGNOSIS — N926 Irregular menstruation, unspecified: Secondary | ICD-10-CM

## 2024-01-05 LAB — POCT URINE PREGNANCY: Preg Test, Ur: NEGATIVE

## 2024-01-05 MED ORDER — DROSPIRENONE-ETHINYL ESTRADIOL 3-0.02 MG PO TABS
1.0000 | ORAL_TABLET | Freq: Every day | ORAL | 11 refills | Status: AC
  Filled 2024-01-05: qty 28, 28d supply, fill #0
  Filled 2024-01-21 – 2024-01-26 (×2): qty 28, 28d supply, fill #1
  Filled 2024-02-25: qty 28, 28d supply, fill #2
  Filled 2024-03-24 – 2024-09-12 (×2): qty 28, 28d supply, fill #3

## 2024-01-05 MED ORDER — DROSPIRENONE-ETHINYL ESTRADIOL 3-0.02 MG PO TABS: 1.0000 | ORAL_TABLET | Freq: Every day | ORAL | 11 refills | Status: DC

## 2024-01-05 NOTE — Progress Notes (Signed)
 Established Patient Office Visit   Subjective  Patient ID: Melanie Blanchard, female    DOB: [birth year]  Age: 23 y.o. MRN: [id number]  Chief Complaint  Patient presents with   Amenorrhea    Last menses was March 31    Patient is a 23 year old female seen for ongoing concern.  Patient endorses missed menses x 11 days.  States LMP was 3/27-3/31.  Endorses unprotected intercourse. On OCPs monthly.  Endorses missing 5 to 6 pills/pack.  Has been out of OCPs x 2 weeks.   Patient Active Problem List   Diagnosis Date Noted   Insomnia 05/13/2022   Adjustment disorder with depressed mood 04/22/2022   Pre-diabetes 02/08/2022   B12 deficiency 02/08/2022   Vitamin D  deficiency 02/08/2022   Seasonal allergies 01/06/2018   Bug bite 06/21/2013   Eczema 12/08/2010   Obesity 11/03/2009   Asthma, moderate persistent, well-controlled 03/07/2008   Asthma, well controlled 03/07/2008   Allergic rhinitis 10/27/2006   Past Medical History:  Diagnosis Date   Asthma    Fatigue    Obesity    SOB (shortness of breath) on exertion    History reviewed. No pertinent surgical history. Social History   Tobacco Use   Smoking status: Never   Smokeless tobacco: Never   Tobacco comments:    no smoking in home  Vaping Use   Vaping status: Never Used  Substance Use Topics   Alcohol use: No   Drug use: No   Family History  Problem Relation Age of Onset   Hypertension Mother    Sleep apnea Mother    Obesity Mother    Hyperlipidemia Father    Cancer Sister 34       Hodgkins lymphoma   Diabetes Maternal Grandfather    Hyperlipidemia Maternal Grandfather    Hypertension Maternal Grandfather    No Known Allergies    ROS Negative unless stated above    Objective:      BP 126/74 (BP Location: Left Wrist, Patient Position: Sitting, Cuff Size: Normal)   Pulse 89   Temp 98.3 F (36.8 C) (Oral)   Ht 5' 2.5" (1.588 m)   Wt (!) 332 lb 3.2 oz (150.7 kg)   LMP 11/28/2023 (Exact  Date)   SpO2 96%   BMI 59.79 kg/m  BP Readings from Last 3 Encounters:  01/05/24 126/74  11/16/23 130/74  06/22/23 (!) 120/90   Wt Readings from Last 3 Encounters:  01/05/24 (!) 332 lb 3.2 oz (150.7 kg)  11/16/23 (!) 324 lb (147 kg)  06/22/23 (!) 314 lb 6.4 oz (142.6 kg)      Physical Exam Constitutional:      Appearance: Normal appearance. She is obese.  HENT:     Head: Normocephalic and atraumatic.     Nose: Nose normal.     Mouth/Throat:     Mouth: Mucous membranes are moist.  Cardiovascular:     Rate and Rhythm: Normal rate.  Pulmonary:     Effort: Pulmonary effort is normal.  Skin:    General: Skin is warm and dry.  Neurological:     Mental Status: She is alert and oriented to person, place, and time.  Psychiatric:        Mood and Affect: Mood normal.        Behavior: Behavior normal.        Judgment: Judgment normal.       01/05/2024   10:49 AM 11/16/2023    2:38 PM 06/22/2023  2:10 PM  Depression screen PHQ 2/9  Decreased Interest 1 1 2   Down, Depressed, Hopeless 2 1 3   PHQ - 2 Score 3 2 5   Altered sleeping 2 1 3   Tired, decreased energy 2 1 2   Change in appetite 2 2 3   Feeling bad or failure about yourself  2 1 3   Trouble concentrating 0 1 2  Moving slowly or fidgety/restless 0 1 2  Suicidal thoughts 1 0 2  PHQ-9 Score 12 9 22   Difficult doing work/chores Somewhat difficult Not difficult at all Very difficult      01/05/2024   10:49 AM 11/16/2023    2:38 PM 06/22/2023    2:11 PM 03/07/2023   10:32 AM  GAD 7 : Generalized Anxiety Score  Nervous, Anxious, on Edge 1 0 2 1  Control/stop worrying 2 0 2 1  Worry too much - different things 2 1 3 1   Trouble relaxing 1 0 3 1  Restless 0 0 2 0  Easily annoyed or irritable 3 1 3 1   Afraid - awful might happen 1 0 2 1  Total GAD 7 Score 10 2 17 6   Anxiety Difficulty Somewhat difficult Not difficult at all  Somewhat difficult     Results for orders placed or performed in visit on 01/05/24  POCT urine  pregnancy  Result Value Ref Range   Preg Test, Ur Negative Negative      Assessment & Plan:  Missed period -     POCT urine pregnancy  Encounter for initial prescription of contraceptive pills -     Drospirenone -Ethinyl Estradiol ; Take 1 tablet by mouth daily.  Dispense: 28 tablet; Refill: 11  Absent menses.  POC urine hCG negative.  Discussed possible reasons for absent menses including increased stress, PCOS, missing doses of OCPs, thyroid  dysfunction medications.  TSH, free T4 normal on 11/16/2023.  Patient advised to set an alarm on her phone to take OCPs more consistently.  Discussed to check for possible causes including FSH, E2, TSH, prolactin, etc.  Patient declines at this time.  Discussed safe sex practices.  For recurrent symptoms obtain labs and ultrasound.  Return if symptoms worsen or fail to improve.   Viola Greulich, MD

## 2024-01-21 ENCOUNTER — Other Ambulatory Visit (HOSPITAL_BASED_OUTPATIENT_CLINIC_OR_DEPARTMENT_OTHER): Payer: Self-pay

## 2024-02-25 ENCOUNTER — Other Ambulatory Visit (HOSPITAL_BASED_OUTPATIENT_CLINIC_OR_DEPARTMENT_OTHER): Payer: Self-pay

## 2024-04-05 ENCOUNTER — Other Ambulatory Visit (HOSPITAL_BASED_OUTPATIENT_CLINIC_OR_DEPARTMENT_OTHER): Payer: Self-pay

## 2024-04-26 ENCOUNTER — Ambulatory Visit: Payer: Self-pay

## 2024-04-26 ENCOUNTER — Ambulatory Visit: Admitting: Family Medicine

## 2024-04-26 ENCOUNTER — Encounter: Payer: Self-pay | Admitting: Family Medicine

## 2024-04-26 ENCOUNTER — Other Ambulatory Visit (HOSPITAL_BASED_OUTPATIENT_CLINIC_OR_DEPARTMENT_OTHER): Payer: Self-pay

## 2024-04-26 VITALS — BP 130/82 | HR 87 | Temp 98.4°F | Ht 62.5 in | Wt 338.4 lb

## 2024-04-26 DIAGNOSIS — G43809 Other migraine, not intractable, without status migrainosus: Secondary | ICD-10-CM | POA: Diagnosis not present

## 2024-04-26 DIAGNOSIS — Z0289 Encounter for other administrative examinations: Secondary | ICD-10-CM

## 2024-04-26 DIAGNOSIS — R638 Other symptoms and signs concerning food and fluid intake: Secondary | ICD-10-CM | POA: Diagnosis not present

## 2024-04-26 MED ORDER — RIZATRIPTAN BENZOATE 10 MG PO TABS
10.0000 mg | ORAL_TABLET | ORAL | 0 refills | Status: AC | PRN
  Filled 2024-04-26: qty 10, 30d supply, fill #0

## 2024-04-26 NOTE — Telephone Encounter (Signed)
 FYI Only or Action Required?: FYI only for provider.  Patient was last seen in primary care on 01/05/2024 by Mercer Clotilda SAUNDERS, MD.  Called Nurse Triage reporting Headache.  Symptoms began 1.5 to 2 weeks ago.  Interventions attempted: OTC medications: Tylenol  and Rest, hydration, or home remedies.  Symptoms are: unchanged.  Triage Disposition: See HCP Within 4 Hours (Or PCP Triage)  Patient/caregiver understands and will follow disposition?: Yes  Reason for Disposition  [1] SEVERE headache (e.g., excruciating) AND [2] not improved after 2 hours of pain medicine  Answer Assessment - Initial Assessment Questions 1. LOCATION: Where does it hurt?      Pain is located in the front of the head and along the sides of the head 2. ONSET: When did the headache start? (e.g., minutes, hours, days)      Started 1.5-2 weeks ago 3. PATTERN: Does the pain come and go, or has it been constant since it started?     constant 4. SEVERITY: How bad is the pain? and What does it keep you from doing?  (e.g., Scale 1-10; mild, moderate, or severe)     Moderate-severe at times-currently pain is mild.  5. RECURRENT SYMPTOM: Have you ever had headaches before? If Yes, ask: When was the last time? and What happened that time?      yes 6. CAUSE: What do you think is causing the headache?     Unsure of what is causing the headache 7. MIGRAINE: Have you been diagnosed with migraine headaches? If Yes, ask: Is this headache similar?      No 8. HEAD INJURY: Has there been any recent injury to your head?      no 9. OTHER SYMPTOMS: Do you have any other symptoms? (e.g., fever, stiff neck, eye pain, sore throat, cold symptoms)     Light sensitivity 10. PREGNANCY: Is there any chance you are pregnant? When was your last menstrual period?       No  Patient was transferred from [place] office-patient made an office visit on mychart for 9/4. Patient reports having daily headaches with  different intensities. Patient states she called out of work today because of a headache. Patient is asking if possible to be seen today. Patient scheduled with another office due to availability. Patient is given address and time information. Patient verbalized understanding.  Protocols used: [facility]

## 2024-04-26 NOTE — Progress Notes (Signed)
 Acute Office Visit  Subjective:     Patient ID: Melanie Blanchard, female    DOB: 2000-10-09, 23 y.o.   MRN: [id number]  Chief Complaint  Patient presents with   Acute Visit    Headache present for about 1 1/2 weeks, tried tylenol  no relief. Light makes it worse    HPI  Discussed the use of AI scribe software for clinical note transcription with the patient, who gave verbal consent to proceed.  History of Present Illness Melanie Blanchard is a 23 year old female who presents with daily headaches for the past two weeks.  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  Constitutional:      General: She is not in acute distress.    Appearance: Normal appearance. She is obese.  HENT:     Head: Normocephalic and atraumatic.     Right Ear: Tympanic membrane, ear canal and external ear normal.     Left Ear: Tympanic membrane, ear canal and external ear normal.     Nose: Nose normal.     Mouth/Throat:     Mouth: Mucous membranes are moist.     Pharynx: Oropharynx is clear.  Eyes:     Extraocular Movements: Extraocular movements intact.     Conjunctiva/sclera: Conjunctivae normal.     Pupils: Pupils are equal, round, and reactive to  light.  Cardiovascular:     Rate and Rhythm: Normal rate and regular rhythm.     Pulses: Normal pulses.     Heart sounds: Normal heart sounds.  Pulmonary:     Effort: Pulmonary effort is normal. No respiratory distress.     Breath sounds: Normal breath sounds. No wheezing, rhonchi or rales.  Musculoskeletal:        General: Normal range of motion.     Cervical back: Normal range of motion.     Right lower leg: No edema.     Left lower leg: No edema.  Lymphadenopathy:     Cervical: No cervical adenopathy.  Neurological:     General: No focal deficit present.     Mental Status: She is alert and oriented to person, place, and time.  Psychiatric:        Mood and Affect: Mood normal.        Thought Content: Thought content normal.     No results found for any visits on 04/26/24.      Assessment & Plan:   Assessment and Plan Assessment & Plan Migraine without aura Experiencing daily headaches with bilateral shooting pain, light sensitivity, and no migraine history. Considered food-related headaches due to inadequate intake. Normotensive, reducing hypertensive  headache concern. - Prescribed Maxalt  at migraine onset, second dose if needed after two hours, max two doses in 24 hours. - Advised rest and naps due to potential drowsiness from Maxalt . - Discussed Maxalt  side effects, including nausea and stomach upset. - Provided work excuse note for today.  Change in Eating Habit Eating once daily, possibly contributing to headaches, likely due to busy schedule. - Advised eating every five hours to ensure nutrition and prevent headaches. - Encouraged eating immediately after clinic visit to assess headache improvement.     No orders of the defined types were placed in this encounter.    Meds ordered this encounter  Medications   rizatriptan  (MAXALT ) 10 MG tablet    Sig: Take 1 tablet (10 mg total) by mouth as needed for migraine. May repeat in 2 hours if needed    Dispense:   10 tablet    Refill:  0    Return if symptoms worsen or fail to improve.  Corean LITTIE Ku, FNP

## 2024-04-26 NOTE — Patient Instructions (Addendum)
 I have sent in rizatriptan  for you to take for headaches.   You may take 1 tablet at the onset of a migraine, you may take a second tablet 2 hours later if the headache is still present. We do not take more than 2 tablets in 24 hours. This medication can make you a bit sleepy, do not drive or operate machinery until you know how this medication will affect you.  Recommend eating at regular intervals, maintaining hydration  Follow-up with me for new or worsening symptoms.

## 2024-05-01 DIAGNOSIS — G43909 Migraine, unspecified, not intractable, without status migrainosus: Secondary | ICD-10-CM | POA: Insufficient documentation

## 2024-05-01 DIAGNOSIS — R638 Other symptoms and signs concerning food and fluid intake: Secondary | ICD-10-CM | POA: Insufficient documentation

## 2024-05-03 ENCOUNTER — Ambulatory Visit: Admitting: Family Medicine

## 2024-05-14 ENCOUNTER — Ambulatory Visit: Admitting: Family Medicine

## 2024-05-14 ENCOUNTER — Encounter: Payer: Self-pay | Admitting: Family Medicine

## 2024-05-14 VITALS — BP 126/74 | HR 75 | Temp 98.5°F | Ht 62.3 in | Wt 351.4 lb

## 2024-05-14 DIAGNOSIS — J029 Acute pharyngitis, unspecified: Secondary | ICD-10-CM | POA: Diagnosis not present

## 2024-05-14 LAB — POCT RAPID STREP A (OFFICE): Rapid Strep A Screen: NEGATIVE

## 2024-05-14 LAB — POC COVID19 BINAXNOW: SARS Coronavirus 2 Ag: NEGATIVE

## 2024-05-14 LAB — POCT INFLUENZA A/B
Influenza A, POC: NEGATIVE
Influenza B, POC: NEGATIVE

## 2024-05-14 NOTE — Progress Notes (Signed)
 Established Patient Office Visit   Subjective  Patient ID: Melanie Blanchard, female    DOB: [birth year]  Age: 23 y.o. MRN: [id number]  Chief Complaint  Patient presents with   Acute Visit    Sore throat started today     Patient is a 23 year old female seen for acute concern.  Patient endorses sore throat x 1 day.  Denies fever, chills, ear pain/pressure, facial pain/pressure, cough, n/v, sick contacts.  Pt had a h/o strep throat as a child, but sx stopped after being told she may need a tonsillectomy.  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    Patient Active Problem List   Diagnosis Date Noted   Change in eating habits 05/01/2024   Migraine 05/01/2024   Insomnia 05/13/2022   Adjustment disorder with depressed mood 04/22/2022   Pre-diabetes 02/08/2022   B12 deficiency 02/08/2022   Vitamin D  deficiency 02/08/2022   Seasonal allergies 01/06/2018   Bug bite 06/21/2013   Eczema 12/08/2010   Obesity 11/03/2009   Asthma, moderate persistent, well-controlled 03/07/2008   Asthma, well controlled 03/07/2008   Allergic rhinitis 10/27/2006   Past Medical History:  Diagnosis Date   Asthma    Fatigue    Obesity    SOB (shortness of breath) on exertion    History reviewed. No pertinent surgical history. Social History   Tobacco Use   Smoking status: Never   Smokeless tobacco: Never   Tobacco comments:    no smoking in home  Vaping Use   Vaping status: Never Used  Substance Use Topics   Alcohol use: No   Drug use: No   Family History  Problem Relation Age of Onset   Hypertension Mother    Sleep apnea Mother    Obesity Mother    Hyperlipidemia Father    Cancer Sister 64       Hodgkins lymphoma   Diabetes Maternal Grandfather    Hyperlipidemia Maternal Grandfather    Hypertension Maternal Grandfather    No Known Allergies  ROS Negative unless stated above    Objective:     BP 126/74 (BP Location: Right Wrist,  Patient Position: Sitting, Cuff Size: Large)   Pulse 75   Temp 98.5 F (36.9 C) (Oral)   Ht 5' 2.3 (1.582 m)   Wt (!) 351 lb 6.4 oz (159.4 kg)   LMP  (LMP Unknown)   SpO2 98%   BMI 63.65 kg/m  BP Readings from Last 3 Encounters:  05/14/24 126/74  04/26/24 130/82  01/05/24 126/74   Wt Readings from Last 3 Encounters:  05/14/24 (!) 351 lb 6.4 oz (159.4 kg)  04/26/24 (!) 338 lb 6.4 oz (153.5 kg)  01/05/24 (!) 332 lb 3.2 oz (150.7 kg)      Physical Exam Constitutional:      General: She is not in acute distress.    Appearance: Normal appearance.  HENT:     Head: Normocephalic and atraumatic.     Nose: Nose normal.     Mouth/Throat:     Mouth: Mucous membranes are moist.     Pharynx: Pharyngeal swelling present. No posterior oropharyngeal erythema.     Tonsils: No tonsillar exudate. 2+ on the right. 2+ on the left.  Cardiovascular:     Rate and Rhythm: Normal rate and regular rhythm.     Heart sounds: Normal heart sounds. No murmur heard.    No gallop.  Pulmonary:     Effort:  Pulmonary effort is normal. No respiratory distress.     Breath sounds: Normal breath sounds. No wheezing, rhonchi or rales.  Skin:    General: Skin is warm and dry.  Neurological:     Mental Status: She is alert and oriented to person, place, and time.        05/14/2024    3:44 PM 04/26/2024   10:58 AM 01/05/2024   10:49 AM  Depression screen PHQ 2/9  Decreased Interest 1 1 1   Down, Depressed, Hopeless 1 2 2   PHQ - 2 Score 2 3 3   Altered sleeping 2 2 2   Tired, decreased energy 2 2 2   Change in appetite 2 2 2   Feeling bad or failure about yourself  1 2 2   Trouble concentrating 1 0 0  Moving slowly or fidgety/restless 0 0 0  Suicidal thoughts 0 0 1  PHQ-9 Score 10 11 12   Difficult doing work/chores  Somewhat difficult Somewhat difficult      05/14/2024    3:44 PM 01/05/2024   10:49 AM 11/16/2023    2:38 PM 06/22/2023    2:11 PM  GAD 7 : Generalized Anxiety Score  Nervous, Anxious, on  Edge 0 1 0 2  Control/stop worrying 0 2 0 2  Worry too much - different things 1 2 1 3   Trouble relaxing 0 1 0 3  Restless 1 0 0 2  Easily annoyed or irritable 2 3 1 3   Afraid - awful might happen 1 1 0 2  Total GAD 7 Score 5 10 2 17   Anxiety Difficulty  Somewhat difficult Not difficult at all      Results for orders placed or performed in visit on 05/14/24  POC COVID-19 BinaxNow  Result Value Ref Range   SARS Coronavirus 2 Ag Negative Negative  POCT rapid strep A  Result Value Ref Range   Rapid Strep A Screen Negative Negative  POC Influenza A/B  Result Value Ref Range   Influenza A, POC Negative Negative   Influenza B, POC Negative Negative      Assessment & Plan:   Sore throat -     POC COVID-19 BinaxNow -     POCT rapid strep A -     POCT Influenza A/B  POC COVID, Flu, step testing negative.  Disussed possible causes of symptoms including viral etiology, seasonal allergies, post-nasal drainage, URI starting or vit def.  Supportive care including rest, hydration, tylenol  prn, losenges, gargling with warm salt water or chloraseptic spray.  F/u for continued or worsened symptoms.  Return if symptoms worsen or fail to improve.   Melanie Blanchard Single, MD

## 2024-05-16 ENCOUNTER — Encounter: Payer: Self-pay | Admitting: Family Medicine

## 2024-05-17 ENCOUNTER — Encounter: Payer: Self-pay | Admitting: Family Medicine

## 2024-05-17 ENCOUNTER — Ambulatory Visit: Admitting: Family Medicine

## 2024-05-17 VITALS — BP 120/77 | HR 102 | Temp 97.5°F | Ht 62.0 in | Wt 343.0 lb

## 2024-05-17 DIAGNOSIS — J309 Allergic rhinitis, unspecified: Secondary | ICD-10-CM | POA: Diagnosis not present

## 2024-05-17 DIAGNOSIS — R059 Cough, unspecified: Secondary | ICD-10-CM | POA: Diagnosis not present

## 2024-05-17 LAB — POCT RAPID STREP A (OFFICE): Rapid Strep A Screen: NEGATIVE

## 2024-05-17 LAB — POC COVID19 BINAXNOW: SARS Coronavirus 2 Ag: NEGATIVE

## 2024-05-17 LAB — POCT INFLUENZA A/B
Influenza A, POC: NEGATIVE
Influenza B, POC: NEGATIVE

## 2024-05-17 NOTE — Telephone Encounter (Signed)
 Patient scheduled.

## 2024-05-17 NOTE — Patient Instructions (Signed)
 It was very nice to see you today!  VISIT SUMMARY: You came in today with a sore throat, cough, and congestion that have been bothering you for a few days. You have been exposed to COVID-19 multiple times recently, but your tests are negative.  YOUR PLAN: ACUTE UPPER RESPIRATORY INFECTION WITH COUGH AND SORE THROAT: You have an upper respiratory infection that is likely caused by a virus, but there is a chance it could become a bacterial infection. -Use the prescribed nasal spray to help with congestion. -Take the antibiotic if your symptoms worsen or do not improve in a few days. -Stay hydrated by drinking plenty of fluids. -Your prescriptions have been sent to [facility] pharmacy. -A work note has been provided for you.  Return if symptoms worsen or fail to improve.   Take care, Dr Kennyth  PLEASE NOTE:  If you had any lab tests, please let us  know if you have not heard back within a few days. You may see your results on mychart before we have a chance to review them but we will give you a call once they are reviewed by us .   If we ordered any referrals today, please let us  know if you have not heard from their office within the next week.   If you had any urgent prescriptions sent in today, please check with the pharmacy within an hour of our visit to make sure the prescription was transmitted appropriately.   Please try these tips to maintain a healthy lifestyle:  Eat at least 3 REAL meals and 1-2 snacks per day.  Aim for no more than 5 hours between eating.  If you eat breakfast, please do so within one hour of getting up.   Each meal should contain half fruits/vegetables, one quarter protein, and one quarter carbs (no bigger than a computer mouse)  Cut down on sweet beverages. This includes juice, soda, and sweet tea.   Drink at least 1 glass of water with each meal and aim for at least 8 glasses per day  Exercise at least 150 minutes every week.

## 2024-05-17 NOTE — Progress Notes (Signed)
   Melanie Blanchard is a 23 y.o. female who presents today for an office visit.  Assessment/Plan:  Cough / Sore Throat Covid, strep, and flu and negative. May have a viral upper respiratory infection (URI).  Has some component of allergic rhinitis which may be contributing as well.  Will start Astelin nasal spray.  Also sent pocket prescription in for azithromycin with instruction not started with symptoms fail to improve over the next few days.  Encouraged hydration.  She can use over-the-counter meds as needed as well.  We discussed reasons to return to care.  Follow-up as needed.     Subjective:  HPI:  See assessment / plan for status of chronic conditions.   Discussed the use of AI scribe software for clinical note transcription with the patient, who gave verbal consent to proceed.  History of Present Illness Melanie Blanchard is a 23 year old female who presents with sore throat, cough, and congestion.  She has been experiencing a sore throat, cough, and congestion for a few days. The sore throat is particularly severe in the morning but improves as the day progresses. Symptoms are generally worse at night or in the morning.  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 Her tests have been negative.  She has a history of frequent strep throat during her childhood. Currently, she experiences runny nose, post-nasal drip, and mucus drainage in the back of her throat, which she did not have earlier in the week.  No fever, but she reports experiencing chills. Occasionally, she experiences ear aches.  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         Objective:  Physical Exam: Ht 5' 2 (1.575 m)   LMP  (LMP Unknown)   BMI 64.27  kg/m   Gen: No acute distress, resting comfortably HEENT: TMs with clear effusion.  OP erythematous.  Mild tonsillar hypertrophy noted.  No lymphadenopathy appreciated. CV: Regular rate and rhythm with no murmurs appreciated Pulm: Normal work of breathing, clear to auscultation bilaterally with no crackles, wheezes, or rhonchi Neuro: Grossly normal, moves all extremities Psych: Normal affect and thought content      Melanie Blanchard M. Kennyth, MD 05/17/2024 2:41 PM

## 2024-05-29 NOTE — Progress Notes (Unsigned)
 [address] [place],  [place], [place] [address]  Office: [phone]  /  Fax: [phone]   Subjective   Initial Visit  Melanie Blanchard (MR# [id number]) is a 23 y.o. female who presents for evaluation and treatment of obesity and related comorbidities. Current BMI is There is no height or weight on file to calculate BMI. Melanie Blanchard has been struggling with her weight for many years and has been unsuccessful in either losing weight, maintaining weight loss, or reaching her healthy weight goal.  Melanie Blanchard is currently in the action stage of change and ready to dedicate time achieving and maintaining a healthier weight. Melanie Blanchard is interested in becoming our patient and working on intensive lifestyle modifications including (but not limited to) diet and exercise for weight loss.  Weight history:  When asked how their weight has affected their life and health, she states: {EMWeightAffected:28305}  When asked what else they would like to accomplish? She states: {EMHopetoaccomplish:28304::Adopt a healthier eating pattern and lifestyle,Improve energy levels and physical activity,Improve existing medical conditions,Improve quality of life}  She starting to note weight gain during : {emstartedtogainweight:31588}.  Life events associated with weight gain include : {emlifeeventsweighgain:31589}.   Other contributing factors: {EMcontributingfactors:28307}.  Their highest weight has been:  *** lbs.  Desired weight: ***  Previous weight-loss programs : {emweightlossprograms:31590::None}.  Their maximum weight loss was:  *** lbs.  Their greatest challenge with dieting: {emgreatestchallengediet:31593}.  Current or previous pharmacotherapy: {EM previousRx:28311}.  Response to medication: {EMResponsetomedication:28312}   Nutritional History:  Current nutrition plan: {EMNutritionplan:28309::None}.  How many times do you eat outside the home: {emfrequency:31645}  How  often do they skip meals: {emskipmeals:31594::does not skip meals}  What beverages do they drink: {embeverages:31595}.   Use of artificial sweetners : {Yes/No:30480221}  Food intolerances or dislikes: {emfoodintolerance:31596::none}.  Food triggers: {emfoodtriggers:31600::None}.  Food cravings: {emfoodcravings:31601}  Do they struggle with excessive hunger or portion control : {YES/NO:21197}   Physical Activity:  Current level of physical activity: {EMcurrentPA:28310::None}  Barriers to Exercise: {embarrierstoexercise:32606::no barriers}   Past medical history includes:   Past Medical History:  Diagnosis Date   Asthma    Fatigue    Obesity    SOB (shortness of breath) on exertion      Objective   LMP 04/27/2024  She was weighed on the bioimpedance scale: There is no height or weight on file to calculate BMI.    Anthropometrics:  No data recorded No data recorded No data recorded No data recorded  Physical Exam:  General: She is overweight, cooperative, alert, well developed, and in no acute distress. PSYCH: Has normal mood, affect and thought process.   HEENT: EOMI, sclerae are anicteric. Lungs: Normal breathing effort, no conversational dyspnea. Extremities: No edema.  Neurologic: No gross sensory or motor deficits. No tremors or fasciculations noted.    Diagnostic Data Reviewed  EKG: Normal sinus rhythm, rate ***. No conduction abnormalities, abnormal Q waves or chamber enlargement.  Indirect Calorimeter completed today shows a VO2 of *** and a REE of ***.  Her calculated basal metabolic rate is *** thus her {zfprmzdlou:68397}.  Depression Screen  Melanie Blanchard's PHQ-9 score was: ***.     05/17/2024    2:39 PM  Depression screen PHQ 2/9  Decreased Interest 0  Down, Depressed, Hopeless 0  PHQ - 2 Score 0  Altered sleeping 0  Tired, decreased energy 0  Change in appetite 0  Feeling bad or failure about yourself  0  Trouble concentrating 0   Moving slowly or fidgety/restless 0  Suicidal thoughts 0  PHQ-9 Score 0  Difficult doing work/chores Not difficult at all    Screening for Sleep Related Breathing Disorders  Melanie Blanchard {Actions; denies/reports/admits to:19208} daytime somnolence and {Actions; denies/reports/admits to:19208} waking up still tired. Patient has a history of symptoms of {OSA Sx:17850}. Melanie Blanchard generally gets {numbers (fuzzy):14653} hours of sleep per night, and states that she has {sleep quality:17851}. Snoring {is/are not:32546} present. Apneic episodes {is/are not:32546} present. Epworth Sleepiness Score is ***.   BMET    Component Value Date/Time   NA 137 11/16/2023 1518   NA 140 01/05/2022 0944   K 4.1 11/16/2023 1518   CL 102 11/16/2023 1518   CO2 26 11/16/2023 1518   GLUCOSE 89 11/16/2023 1518   BUN 13 11/16/2023 1518   BUN 14 01/05/2022 0944   CREATININE 0.97 11/16/2023 1518   CALCIUM 9.4 11/16/2023 1518   Lab Results  Component Value Date   HGBA1C 6.0 11/16/2023   HGBA1C 5.4 06/21/2013   Lab Results  Component Value Date   INSULIN  23.2 01/05/2022   CBC    Component Value Date/Time   WBC 4.6 03/07/2023 1131   RBC 5.01 03/07/2023 1131   HGB 12.5 03/07/2023 1131   HGB 12.6 01/05/2022 0944   HCT 39.7 03/07/2023 1131   HCT 39.4 01/05/2022 0944   PLT 372.0 03/07/2023 1131   PLT 413 01/05/2022 0944   MCV 79.2 03/07/2023 1131   MCV 81 01/05/2022 0944   MCH 25.8 (L) 01/05/2022 0944   MCHC 31.4 03/07/2023 1131   RDW 15.3 03/07/2023 1131   RDW 13.9 01/05/2022 0944   Iron/TIBC/Ferritin/ %Sat No results found for: IRON, TIBC, FERRITIN, IRONPCTSAT Lipid Panel     Component Value Date/Time   CHOL 153 03/07/2023 1131   CHOL 204 (H) 01/05/2022 0944   TRIG 102.0 03/07/2023 1131   HDL 48.70 03/07/2023 1131   HDL 62 01/05/2022 0944   CHOLHDL 3 03/07/2023 1131   VLDL 20.4 03/07/2023 1131   LDLCALC 84 03/07/2023 1131   LDLCALC 119 (H) 01/05/2022 0944   LDLDIRECT 65.0 06/09/2017  1704   Hepatic Function Panel     Component Value Date/Time   PROT 7.6 11/16/2023 1518   PROT 7.6 01/05/2022 0944   ALBUMIN 3.9 11/16/2023 1518   ALBUMIN 4.2 01/05/2022 0944   AST 21 11/16/2023 1518   ALT 19 11/16/2023 1518   ALKPHOS 66 11/16/2023 1518   BILITOT 0.3 11/16/2023 1518   BILITOT 0.4 01/05/2022 0944      Component Value Date/Time   TSH 2.19 11/16/2023 1518     Assessment and Plan   TREATMENT PLAN FOR OBESITY:  Recommended Dietary Goals  Melanie Blanchard is currently in the action stage of change. As such, her goal is to implement medically supervised obesity management plan.  She has agreed to implement: {emwtlossplannewint:31639}  Behavioral Intervention  We discussed the following Behavioral Modification Strategies today: {EMwtlossstrategiesnewint:31640::increasing lean protein intake to established goals,decreasing simple carbohydrates ,increasing vegetables,increasing lower glycemic fruits,increasing fiber rich foods,avoiding skipping meals,increasing water intake,work on meal planning and preparation,reading food labels ,keeping healthy foods at home,identifying sources and decreasing liquid calories,decreasing eating out or consumption of processed foods, and making healthy choices when eating convenient foods,planning for success,better snacking choices}  Additional resources provided today: {emadditionalresourcesnewint:32116::Handout on healthy eating and balanced plate,Handout on complex carbohydrates and lean sources of protein,Handout principles of weight management}  Recommended Physical Activity Goals  Melanie Blanchard has been advised to work up to 150 minutes of moderate intensity aerobic activity a week and  strengthening exercises 2-3 times per week for cardiovascular health, weight loss maintenance and preservation of muscle mass.   She has agreed to :  {EMEXERCISE:28847::Think about enjoyable ways to increase daily physical  activity and overcoming barriers to exercise,Increase physical activity in their day and reduce sedentary time (increase NEAT).,Increase volume of physical activity to a goal of 240 minutes a week,Combine aerobic and strengthening exercises for efficiency and improved cardiometabolic health.}  Medical Interventions and Pharmacotherapy We will work on building a Therapist, art and behavioral strategies. We will discuss the role of pharmacotherapy as an adjunct at subsequent visits.   ASSOCIATED CONDITIONS ADDRESSED TODAY  Other Fatigue ***. Melanie Blanchard does feel that her weight is causing her energy to be lower than it should be. Fatigue may be related to obesity, depression or many other causes. Labs will be ordered, and in the meanwhile, Melanie Blanchard will focus on self care including making healthy food choices, increasing physical activity and focusing on stress reduction.  Shortness of Breath Melanie Blanchard notes increasing shortness of breath with physical activity and seems to be worsening over time with weight gain. She notes getting out of breath sooner with activity than she used to. This has not gotten worse recently. Melanie Blanchard denies shortness of breath at rest or orthopnea.  There are no diagnoses linked to this encounter.  Follow-up  She was informed of the importance of frequent follow-up visits to maximize her success with intensive lifestyle modifications for her multiple health conditions. She was informed we would discuss her lab results at her next visit unless there is a critical issue that needs to be addressed sooner. Melanie Blanchard agreed to keep her next visit at the agreed upon time to discuss these results.  Attestation Statement  This is the patient's intake visit at [facility]. The patient's Health Questionnaire was reviewed at length. Included in the packet: current and past health history, medications, allergies, ROS, gynecologic history (women  only), surgical history, family history, social history, weight history, weight loss surgery history (for those that have had weight loss surgery), nutritional evaluation, mood and food questionnaire, PHQ9, Epworth questionnaire, sleep habits questionnaire, patient life and health improvement goals questionnaire. These will all be scanned into the patient's chart under media.   During the visit, I independently reviewed the patient's EKG***, previous labs, bioimpedance scale results, and indirect calorimetry results. I used this information to medically tailor a meal plan for the patient that will help her to lose weight and will improve her obesity-related conditions. I performed a medically necessary appropriate examination and/or evaluation. I discussed the assessment and treatment plan with the patient. The patient was provided an opportunity to ask questions and all were answered. The patient agreed with the plan and demonstrated an understanding of the instructions. Labs were ordered at this visit and will be reviewed at the next visit unless critical results need to be addressed immediately. Clinical information was updated and documented in the EMR.   In addition, they received basic education on identification of processed foods and reduction of these, different sources of lean proteins and complex carbohydrates and how to eat balanced by incorporation of whole foods.  Reviewed by clinician on day of visit: allergies, medications, problem list, medical history, surgical history, family history, social history, and previous encounter notes.  I have spent *** minutes in the care of the patient today including: {NUMBER 1-10:22536} minutes before the visit reviewing and preparing the chart. *** minutes face-to-face {emfacetoface:32598::assessing and reviewing listed medical problems  as outlined in obesity care plan,providing nutritional and behavioral counseling on topics outlined in the obesity  care plan,independently interpreting test results and goals of care, as described in assessment and plan,reviewing and discussing biometric information and progress} {NUMBER 1-10:22536} minutes after the visit updating chart and documentation of encounter.       Diamonique Ruedas ANP-C

## 2024-05-30 ENCOUNTER — Ambulatory Visit (INDEPENDENT_AMBULATORY_CARE_PROVIDER_SITE_OTHER): Admitting: Nurse Practitioner

## 2024-05-30 ENCOUNTER — Encounter (INDEPENDENT_AMBULATORY_CARE_PROVIDER_SITE_OTHER): Payer: Self-pay | Admitting: Nurse Practitioner

## 2024-05-30 VITALS — BP 122/79 | HR 71 | Temp 98.1°F | Ht 62.0 in | Wt 340.0 lb

## 2024-05-30 DIAGNOSIS — E559 Vitamin D deficiency, unspecified: Secondary | ICD-10-CM | POA: Diagnosis not present

## 2024-05-30 DIAGNOSIS — R7303 Prediabetes: Secondary | ICD-10-CM | POA: Diagnosis not present

## 2024-05-30 DIAGNOSIS — E66813 Obesity, class 3: Secondary | ICD-10-CM | POA: Diagnosis not present

## 2024-05-30 DIAGNOSIS — E7800 Pure hypercholesterolemia, unspecified: Secondary | ICD-10-CM | POA: Diagnosis not present

## 2024-05-30 DIAGNOSIS — Z1331 Encounter for screening for depression: Secondary | ICD-10-CM | POA: Diagnosis not present

## 2024-05-30 DIAGNOSIS — R0602 Shortness of breath: Secondary | ICD-10-CM | POA: Insufficient documentation

## 2024-05-30 DIAGNOSIS — R5383 Other fatigue: Secondary | ICD-10-CM | POA: Diagnosis not present

## 2024-05-30 DIAGNOSIS — Z6841 Body Mass Index (BMI) 40.0 and over, adult: Secondary | ICD-10-CM

## 2024-05-30 DIAGNOSIS — T733XXA Exhaustion due to excessive exertion, initial encounter: Secondary | ICD-10-CM | POA: Insufficient documentation

## 2024-05-31 ENCOUNTER — Ambulatory Visit (INDEPENDENT_AMBULATORY_CARE_PROVIDER_SITE_OTHER): Payer: Self-pay | Admitting: Nurse Practitioner

## 2024-05-31 LAB — LIPID PANEL
Chol/HDL Ratio: 3 ratio (ref 0.0–4.4)
Cholesterol, Total: 203 mg/dL — ABNORMAL HIGH (ref 100–199)
HDL: 67 mg/dL (ref >39)
LDL Chol Calc (NIH): 110 mg/dL — ABNORMAL HIGH (ref 0–99)
Triglycerides: 153 mg/dL — ABNORMAL HIGH (ref 0–149)
VLDL Cholesterol Cal: 26 mg/dL (ref 5–40)

## 2024-05-31 LAB — COMPREHENSIVE METABOLIC PANEL WITH GFR
ALT: 17 IU/L (ref 0–32)
AST: 23 IU/L (ref 0–40)
Albumin: 4.2 g/dL (ref 4.0–5.0)
Alkaline Phosphatase: 84 IU/L (ref 41–116)
BUN/Creatinine Ratio: 13 (ref 9–23)
BUN: 10 mg/dL (ref 6–20)
Bilirubin Total: 0.3 mg/dL (ref 0.0–1.2)
CO2: 21 mmol/L (ref 20–29)
Calcium: 9.6 mg/dL (ref 8.7–10.2)
Chloride: 100 mmol/L (ref 96–106)
Creatinine, Ser: 0.77 mg/dL (ref 0.57–1.00)
Globulin, Total: 3.2 g/dL (ref 1.5–4.5)
Glucose: 85 mg/dL (ref 70–99)
Potassium: 4.5 mmol/L (ref 3.5–5.2)
Sodium: 138 mmol/L (ref 134–144)
Total Protein: 7.4 g/dL (ref 6.0–8.5)
eGFR: 112 mL/min/1.73 (ref >59)

## 2024-05-31 LAB — INSULIN, RANDOM: INSULIN: 40.7 u[IU]/mL — ABNORMAL HIGH (ref 2.6–24.9)

## 2024-05-31 LAB — CBC WITH DIFFERENTIAL/PLATELET
Basophils Absolute: 0 x10E3/uL (ref 0.0–0.2)
Basos: 1 %
EOS (ABSOLUTE): 0.1 x10E3/uL (ref 0.0–0.4)
Eos: 2 %
Hematocrit: 41.7 % (ref 34.0–46.6)
Hemoglobin: 13 g/dL (ref 11.1–15.9)
Immature Grans (Abs): 0 x10E3/uL (ref 0.0–0.1)
Immature Granulocytes: 0 %
Lymphocytes Absolute: 2.5 x10E3/uL (ref 0.7–3.1)
Lymphs: 44 %
MCH: 25.7 pg — ABNORMAL LOW (ref 26.6–33.0)
MCHC: 31.2 g/dL — ABNORMAL LOW (ref 31.5–35.7)
MCV: 82 fL (ref 79–97)
Monocytes Absolute: 0.5 x10E3/uL (ref 0.1–0.9)
Monocytes: 9 %
Neutrophils Absolute: 2.6 x10E3/uL (ref 1.4–7.0)
Neutrophils: 44 %
Platelets: 400 x10E3/uL (ref 150–450)
RBC: 5.06 x10E6/uL (ref 3.77–5.28)
RDW: 14.3 % (ref 11.7–15.4)
WBC: 5.7 x10E3/uL (ref 3.4–10.8)

## 2024-05-31 LAB — VITAMIN B12: Vitamin B-12: 534 pg/mL (ref 232–1245)

## 2024-05-31 LAB — TSH: TSH: 2.1 u[IU]/mL (ref 0.450–4.500)

## 2024-05-31 LAB — HEMOGLOBIN A1C
Est. average glucose Bld gHb Est-mCnc: 128 mg/dL
Hgb A1c MFr Bld: 6.1 % — ABNORMAL HIGH (ref 4.8–5.6)

## 2024-05-31 LAB — VITAMIN D 25 HYDROXY (VIT D DEFICIENCY, FRACTURES): Vit D, 25-Hydroxy: 11.1 ng/mL — ABNORMAL LOW (ref 30.0–100.0)

## 2024-05-31 LAB — MAGNESIUM: Magnesium: 2.2 mg/dL (ref 1.6–2.3)

## 2024-06-13 ENCOUNTER — Encounter (INDEPENDENT_AMBULATORY_CARE_PROVIDER_SITE_OTHER): Payer: Self-pay | Admitting: Nurse Practitioner

## 2024-06-13 ENCOUNTER — Ambulatory Visit (INDEPENDENT_AMBULATORY_CARE_PROVIDER_SITE_OTHER): Admitting: Nurse Practitioner

## 2024-06-13 ENCOUNTER — Other Ambulatory Visit (HOSPITAL_BASED_OUTPATIENT_CLINIC_OR_DEPARTMENT_OTHER): Payer: Self-pay

## 2024-06-13 VITALS — BP 128/82 | HR 82 | Temp 98.5°F | Ht 62.0 in | Wt 339.0 lb

## 2024-06-13 DIAGNOSIS — E559 Vitamin D deficiency, unspecified: Secondary | ICD-10-CM | POA: Diagnosis not present

## 2024-06-13 DIAGNOSIS — R7303 Prediabetes: Secondary | ICD-10-CM

## 2024-06-13 DIAGNOSIS — E88819 Insulin resistance, unspecified: Secondary | ICD-10-CM | POA: Diagnosis not present

## 2024-06-13 DIAGNOSIS — E782 Mixed hyperlipidemia: Secondary | ICD-10-CM

## 2024-06-13 DIAGNOSIS — E66813 Obesity, class 3: Secondary | ICD-10-CM | POA: Diagnosis not present

## 2024-06-13 DIAGNOSIS — Z6841 Body Mass Index (BMI) 40.0 and over, adult: Secondary | ICD-10-CM

## 2024-06-13 MED ORDER — VITAMIN D (ERGOCALCIFEROL) 1.25 MG (50000 UNIT) PO CAPS
50000.0000 [IU] | ORAL_CAPSULE | ORAL | 1 refills | Status: AC
  Filled 2024-06-13: qty 5, 35d supply, fill #0
  Filled 2024-09-12: qty 5, 35d supply, fill #1

## 2024-06-13 MED ORDER — METFORMIN HCL ER 500 MG PO TB24
500.0000 mg | ORAL_TABLET | Freq: Every day | ORAL | 0 refills | Status: AC
  Filled 2024-06-13: qty 30, 30d supply, fill #0

## 2024-06-13 NOTE — Progress Notes (Signed)
 Office: [phone]  /  Fax: [phone]  WEIGHT SUMMARY AND BIOMETRICS  Weight Lost Since Last Visit: 1lb  Weight Gained Since Last Visit: 0lb   Vitals Temp: 98.5 F (36.9 C) BP: 128/82 Pulse Rate: 82 SpO2: 99 %   Anthropometric Measurements Height: 5' 2 (1.575 m) Weight: (!) 339 lb (153.8 kg) BMI (Calculated): 61.99 Weight at Last Visit: 340lb Weight Lost Since Last Visit: 1lb Weight Gained Since Last Visit: 0lb Starting Weight: 340lb Total Weight Loss (lbs): 1 lb (0.454 kg) Peak Weight: 340lb Waist Measurement : 58.5 inches   Body Composition  Body Fat %: 55.8 % Fat Mass (lbs): 189 lbs Muscle Mass (lbs): 142.6 lbs Total Body Water (lbs): 111 lbs Visceral Fat Rating : 22   Other Clinical Data Fasting: No Labs: No Today's Visit #: 2 Starting Date: 05/30/24    Total Weight Loss: 1 pound  [employer] reviewed with patient: muscle down 1.2 pounds, adipose down 0.4 pounds  HPI  Chief Complaint: OBESITY  Melanie Blanchard is here to discuss her progress with her obesity treatment plan. She is on the the Category 3 Plan and states she is following her eating plan approximately 50 % of the time. She states she is currently not exercising, has been focusing on nutrition plan.    Interval History:  Since last office visit she has been doing well getting protein for breakfast.  Breakfast is 3 eggs and yogurt with a piece of toast. She continues to skip lunch. She will get busy during lunch.  Dinner has been protein and vegetables. She is still getting used to eating 1500 calories. She is getting at least 64 ounces of water. She does plan to walk 2 days a week for 20 minutes. She has not been feeling hungry or deprived, no cravings.    Labs are reviewed with patient in detail. Normal liver and kidney functions, glucose, electrolytes, blood count, Vit B12 and thyroid . A1c is in prediabetic range. Fasting insulin  is elevated at 40.7 with HOMA-IR score of 8.54  indicating insulin  resistance- currently on no medication. Lipid panel reveals mixed hyperlipidemia and is currently on no medication. Vitamin D  is very low at 11.1- requires supplementation  Cancer Screenings for possible obesity related cancers Pap: Needs to schedule   PHYSICAL EXAM:  Blood pressure 128/82, pulse 82, temperature 98.5 F (36.9 C), height 5' 2 (1.575 m), weight (!) 339 lb (153.8 kg), last menstrual period 04/27/2024, SpO2 99%. Body mass index is 62 kg/m.  General: Well Developed, well nourished, and in no acute distress.  HEENT: Normocephalic, atraumatic; EOMI, sclerae are anicteric. Skin: Warm and dry, good turgor Chest:  Normal excursion, shape, no gross ABN Respiratory: No conversational dyspnea; speaking in full sentences NeuroM-Sk:  Normal gross ROM * 4 extremities  Psych: A and O X 3, insight adequate, mood- full    DIAGNOSTIC DATA REVIEWED:  Last metabolic panel Lab Results  Component Value Date   GLUCOSE 85 05/30/2024   NA 138 05/30/2024   K 4.5 05/30/2024   CL 100 05/30/2024   CO2 21 05/30/2024   BUN 10 05/30/2024   CREATININE 0.77 05/30/2024   EGFR 112 05/30/2024   CALCIUM 9.6 05/30/2024   PROT 7.4 05/30/2024   ALBUMIN 4.2 05/30/2024   LABGLOB 3.2 05/30/2024   AGRATIO 1.2 01/05/2022   BILITOT 0.3 05/30/2024   ALKPHOS 84 05/30/2024   AST 23 05/30/2024   ALT 17 05/30/2024     Lab Results  Component Value Date   HGBA1C  6.1 (H) 05/30/2024   HGBA1C 5.4 06/21/2013   Lab Results  Component Value Date   INSULIN  40.7 (H) 05/30/2024   INSULIN  23.2 01/05/2022   Lab Results  Component Value Date   TSH 2.100 05/30/2024   CBC    Component Value Date/Time   WBC 5.7 05/30/2024 0848   WBC 4.6 03/07/2023 1131   RBC 5.06 05/30/2024 0848   RBC 5.01 03/07/2023 1131   HGB 13.0 05/30/2024 0848   HCT 41.7 05/30/2024 0848   PLT 400 05/30/2024 0848   MCV 82 05/30/2024 0848   MCH 25.7 (L) 05/30/2024 0848   MCHC 31.2 (L) 05/30/2024 0848   MCHC  31.4 03/07/2023 1131   RDW 14.3 05/30/2024 0848    Lipid Panel     Component Value Date/Time   CHOL 203 (H) 05/30/2024 0848   TRIG 153 (H) 05/30/2024 0848   HDL 67 05/30/2024 0848   CHOLHDL 3.0 05/30/2024 0848   CHOLHDL 3 03/07/2023 1131   VLDL 20.4 03/07/2023 1131   LDLCALC 110 (H) 05/30/2024 0848   LDLDIRECT 65.0 06/09/2017 1704    Nutritional Lab Results  Component Value Date   VD25OH 11.1 (L) 05/30/2024   VD25OH 12.92 (L) 06/22/2023   VD25OH 15.94 (L) 03/04/2023   Lab Results  Component Value Date   VITAMINB12 534 05/30/2024     ASSESSMENT AND PLAN  Class 3 severe obesity with serious comorbidity and body mass index (BMI) of 60.0 to 69.9 in adult, unspecified obesity type (HCC) TREATMENT PLAN FOR OBESITY:  Recommended Dietary Goals  Melanie Blanchard is currently in the action stage of change. As such, her goal is to continue weight management plan. She has agreed to the Category 3 Plan.  Behavioral Intervention  We discussed the following Behavioral Modification Strategies today: increasing lean protein intake to established goals, decreasing simple carbohydrates , increasing vegetables, increasing fiber rich foods, avoiding skipping meals, work on meal planning and preparation, work on tracking and journaling calories using tracking application, and continue to work on maintaining a reduced calorie state, getting the recommended amount of protein, incorporating whole foods, making healthy choices, staying well hydrated and practicing mindfulness when eating.Focus on getting 100 grams of protein daily and avoid skipping meals   Recommended Physical Activity Goals  Melanie Blanchard has been advised to work up to 150 minutes of moderate intensity aerobic activity a week and strengthening exercises 2-3 times per week for cardiovascular health, weight loss maintenance and preservation of muscle mass.   She has agreed to Patient will begin to exercise by walking twice a week for 20  minutes, Think about enjoyable ways to increase daily physical activity and overcoming barriers to exercise, and Increase physical activity in their day and reduce sedentary time (increase NEAT).   Pharmacotherapy We discussed various medication options to help Melanie Blanchard with her weight loss efforts and we both agreed to start Metformin XR 500 mg 1 tab daily with meal for prediabetes and insulin  resistance. She will also start ergocalciferol  50000 units once a week for very low Vitamin D  and will recheck her level in 3 months.  ASSOCIATED CONDITIONS ADDRESSED TODAY  Action/Plan  Pre-diabetes Insulin  resistance Continue adjusting to Category 3  meal plan, limit simple carbohydrates, make sure you are reaching protein goals daily and avoid skipping meals Decreasing body weight by 10-15% can improve glucose levels Start Metformin XR 500 mg 1 tab po every day Start walking 2 days a week for 20 minutes.  -     metFORMIN HCl  ER; Take 1 tablet (500 mg total) by mouth daily with breakfast.  Dispense: 30 tablet; Refill: 0  Mixed hyperlipidemia Focus on implementing category 3 meal plan, limit saturated fats, make sure to reach protein goal daily Loss of 10-15% body weight can improve lipid levels Focus on starting exercise with initial goal of 20 minutes of walking 2 days a week  Vitamin D  deficiency Start supplementation with Ergocalciferol  50000 units once a week for 12 weeks and then recheck level.   -     Vitamin D  (Ergocalciferol ); Take 1 capsule (50,000 Units total) by mouth every 7 (seven) days.  Dispense: 5 capsule; Refill: 1       Return in about 3 weeks (around 07/04/2024).Melanie Blanchard She was informed of the importance of frequent follow up visits to maximize her success with intensive lifestyle modifications for her multiple health conditions.   ATTESTASTION STATEMENTS:  Reviewed by clinician on day of visit: allergies, medications, problem list, medical history, surgical history, family  history, social history, and previous encounter notes.   I personally spent a total of 30 minutes in the care of the patient today including preparing to see the patient, getting/reviewing separately obtained history, performing a medically appropriate exam/evaluation, counseling and educating, placing orders, documenting clinical information in the EHR, independently interpreting results, communicating results, and coordinating care.   Ashawna Hanback ANP-C

## 2024-06-29 ENCOUNTER — Ambulatory Visit
Admission: RE | Admit: 2024-06-29 | Discharge: 2024-06-29 | Disposition: A | Discharge status: Home / Self Care | Payer: Worker's Compensation | Source: Ambulatory Visit | Attending: Occupational Medicine | Admitting: Occupational Medicine

## 2024-06-29 ENCOUNTER — Other Ambulatory Visit: Payer: Self-pay | Admitting: Occupational Medicine

## 2024-06-29 DIAGNOSIS — S93402A Sprain of unspecified ligament of left ankle, initial encounter: Secondary | ICD-10-CM

## 2024-07-03 ENCOUNTER — Ambulatory Visit (INDEPENDENT_AMBULATORY_CARE_PROVIDER_SITE_OTHER): Payer: Self-pay | Admitting: Nurse Practitioner

## 2024-07-19 ENCOUNTER — Ambulatory Visit (INDEPENDENT_AMBULATORY_CARE_PROVIDER_SITE_OTHER): Payer: Self-pay | Admitting: Nurse Practitioner

## 2024-08-02 ENCOUNTER — Encounter: Payer: Self-pay | Admitting: Family Medicine

## 2024-08-02 ENCOUNTER — Ambulatory Visit: Admitting: Family Medicine

## 2024-08-02 VITALS — BP 138/100 | HR 94 | Temp 98.3°F | Wt 348.4 lb

## 2024-08-02 DIAGNOSIS — R030 Elevated blood-pressure reading, without diagnosis of hypertension: Secondary | ICD-10-CM

## 2024-08-02 DIAGNOSIS — W19XXXD Unspecified fall, subsequent encounter: Secondary | ICD-10-CM

## 2024-08-02 DIAGNOSIS — M25572 Pain in left ankle and joints of left foot: Secondary | ICD-10-CM

## 2024-08-02 DIAGNOSIS — S93402D Sprain of unspecified ligament of left ankle, subsequent encounter: Secondary | ICD-10-CM | POA: Diagnosis not present

## 2024-08-02 DIAGNOSIS — M25472 Effusion, left ankle: Secondary | ICD-10-CM

## 2024-08-02 NOTE — Progress Notes (Addendum)
 "  Established Patient Office Visit   Subjective  Patient ID: Melanie Blanchard, female    DOB: [birth year]  Age: 23 y.o. MRN: [id number]  Chief Complaint  Patient presents with   Acute Visit    Patient came in today for left ankle work injury, happened on Jun 28 2024, patient fell, patient rates her pain 7 out of 10, patient has pain on the lateral side of the ankle, patient has seen a provider on Oct 31th and has a small achilles tendon tear and ankle sprain      Pt is a 23 yo female seen for f/u s/p injury at work.  Initially seen by workman's comp provider.  Pt fell at work on 10/30 after slipping on a ramp twisting L ankle and landing on L knee. States was dx'd with a  severe sprain of ankle with a possible small tear in the Achilles tendon by a workers' chief operating officer.  Xray was negative.  Delays in filing the workers' compensation claim have prevented follow-up with an orthopedic specialist.  Pt endorses  persistent swelling in L ankle, particularly on the top of her foot, L side, and the back near the Achilles. Pain is rated 7-8/10 when walking.    Taking ibuprofen  and Tylenol  for pain relief.  Also using ice, a brace, a boot, and crutches.  Out of work since the injury, except for two brief attempts to return, which were unsuccessful due to her inability to perform her duties. Works in a group home.  A lawyer is involved due to complications with the workers' compensation process, including delays in claim processing and lack of communication from her employer. She has not received any follow-up or communication from her employer regarding her work status or the claim.  BP elevated.  Denies HAs, CP, changes in vision.    Patient Active Problem List   Diagnosis Date Noted   Fatigue due to excessive exertion 05/30/2024   Depression screening 05/30/2024   SOB (shortness of breath) on exertion 05/30/2024   Change in eating habits 05/01/2024   Migraine 05/01/2024    Insomnia 05/13/2022   Adjustment disorder with depressed mood 04/22/2022   Pre-diabetes 02/08/2022   B12 deficiency 02/08/2022   Vitamin D  deficiency 02/08/2022   Seasonal allergies 01/06/2018   Bug bite 06/21/2013   Eczema 12/08/2010   Obesity 11/03/2009   Asthma, moderate persistent, well-controlled 03/07/2008   Asthma, well controlled 03/07/2008   Allergic rhinitis 10/27/2006   Past Medical History:  Diagnosis Date   Anxiety    Asthma    Depression    Fatigue    Obesity    Prediabetes    SOB (shortness of breath) on exertion    Vitamin B 12 deficiency    History reviewed. No pertinent surgical history. Social History   Tobacco Use   Smoking status: Never   Smokeless tobacco: Never   Tobacco comments:    no smoking in home  Vaping Use   Vaping status: Never Used  Substance Use Topics   Alcohol use: No   Drug use: No   Family History  Problem Relation Age of Onset   Hypertension Mother    Sleep apnea Mother    Obesity Mother    Hyperlipidemia Father    Cancer Sister 72       Hodgkins lymphoma   Diabetes Maternal Grandfather    Hyperlipidemia Maternal Grandfather    Hypertension Maternal Grandfather    No Known Allergies  ROS Negative  unless stated above    Objective:     BP (!) 138/100 (BP Location: Left Arm, Patient Position: Sitting, Cuff Size: Large)   Pulse 94   Temp 98.3 F (36.8 C) (Oral)   Wt (!) 348 lb 6.4 oz (158 kg)   SpO2 98%   BMI 63.72 kg/m  BP Readings from Last 3 Encounters:  08/02/24 (!) 138/100  06/13/24 128/82  05/30/24 122/79   Wt Readings from Last 3 Encounters:  08/02/24 (!) 348 lb 6.4 oz (158 kg)  06/13/24 (!) 339 lb (153.8 kg)  05/30/24 (!) 340 lb (154.2 kg)      Physical Exam Constitutional:      General: She is not in acute distress.    Appearance: Normal appearance.  HENT:     Head: Normocephalic and atraumatic.     Nose: Nose normal.     Mouth/Throat:     Mouth: Mucous membranes are moist.   Cardiovascular:     Rate and Rhythm: Normal rate and regular rhythm.     Heart sounds: Normal heart sounds. No murmur heard.    No gallop.  Pulmonary:     Effort: Pulmonary effort is normal. No respiratory distress.     Breath sounds: Normal breath sounds. No wheezing, rhonchi or rales.  Musculoskeletal:     Right ankle: Normal.     Left ankle: Tenderness present.     Left Achilles Tendon: Tenderness present.     Right foot: Normal.     Comments: Mild edema of L lateral ankle and foot.  TTP of distal L achilles, dorsum of L foot proximal to ankle.  Skin:    General: Skin is warm and dry.  Neurological:     Mental Status: She is alert and oriented to person, place, and time.        08/02/2024   11:50 AM 05/30/2024    8:11 AM 05/17/2024    2:39 PM  Depression screen PHQ 2/9  Decreased Interest 2 2 0  Down, Depressed, Hopeless 2 2 0  PHQ - 2 Score 4 4 0  Altered sleeping 2 3 0  Tired, decreased energy 2 2 0  Change in appetite 2 2 0  Feeling bad or failure about yourself  2 2 0  Trouble concentrating 1 2 0  Moving slowly or fidgety/restless 1 0 0  Suicidal thoughts 1 1 0  PHQ-9 Score 15 16  0   Difficult doing work/chores  Somewhat difficult Not difficult at all     Data saved with a previous flowsheet row definition      08/02/2024   11:51 AM 05/14/2024    3:44 PM 01/05/2024   10:49 AM 11/16/2023    2:38 PM  GAD 7 : Generalized Anxiety Score  Nervous, Anxious, on Edge 2 0 1 0  Control/stop worrying 1 0 2 0  Worry too much - different things 2 1 2 1   Trouble relaxing 1 0 1 0  Restless 1 1 0 0  Easily annoyed or irritable 2 2 3 1   Afraid - awful might happen 1 1 1  0  Total GAD 7 Score 10 5 10 2   Anxiety Difficulty   Somewhat difficult Not difficult at all     No results found for any visits on 08/02/24.    Assessment & Plan:   Sprain of left ankle, unspecified ligament, subsequent encounter -     Ambulatory referral to Orthopedic Surgery  Pain and swelling of  left ankle  Fall, subsequent encounter  Elevated blood pressure reading without diagnosis of hypertension  Continued L ankle  and foot pain s/p fall 1 month ago.  Xray 06/29/24 with concern for deltoid ligament injury d/t widening of medial space.  Workman's comp case.  Legal representation in place.  Discussed typical course of symptoms in ankle sprain.  Given continued /worsened symptoms despite conservative measures, referral to Ortho.  Continue Supportive care including CAM boot, elevation, ice/heat, stretching.  Advised to consider PT.  Repeat imaging with Ortho.  BP elevated.  Pain possibly contributing.  Recheck.  Lifestyle modifications strongly encouraged.  Pt to monitor bp at home.  For consistent elevations greater than 140/90, start norvasc 5 mg.  Also advised to limit NSAID use.  Return if symptoms worsen or fail to improve.   Clotilda JONELLE Single, MD "

## 2024-08-03 ENCOUNTER — Ambulatory Visit: Admitting: Physician Assistant

## 2024-08-03 ENCOUNTER — Other Ambulatory Visit: Payer: Self-pay

## 2024-08-03 DIAGNOSIS — M25572 Pain in left ankle and joints of left foot: Secondary | ICD-10-CM

## 2024-08-03 NOTE — Progress Notes (Unsigned)
 Office Visit Note   Patient: Melanie Blanchard           Date of Birth: 06-22-2001           MRN: [id number] Visit Date: 08/03/2024              Requested by: Mercer Clotilda SAUNDERS, MD [address] [place],  [place] [address] PCP: Mercer Clotilda SAUNDERS, MD  Chief Complaint  Patient presents with   Left Ankle - Pain      HPI: 23 y/o female who slipped and fell at work on a ramp.  She instantly had lateral and posterior ankle pain.  She developed swelling.  She was instructed to take tylenol .  She was placed in a Cam boot and ASO brace to wean into when her pain improved.  She feels like she is trending towards getting better, but still has daily pain with weight bearing activity.  She uses ice PRN mainly at the end of the day due increased pain.  First thing in the am increased pain and stiffness.  There are no progress notes available for review.  The xray was suspicious for medial deltoid ligament injury with widening.    Assessment & Plan: Visit Diagnoses:  1. Pain in left ankle and joints of left foot     Plan: She will continue to wear the ASO ankle brace for support and mild compression.  ICE and OTC antiinflammatories PRN.  Elevation if she has edema.  I will refer her to PT for ankle strengthening and mobility.  If she fails conservative treatments we will plan to order an MRI to assess her ankle for soft tissue injury.    Follow-Up Instructions: Return in about 4 weeks (around 08/31/2024).   Ortho Exam  Patient is alert, oriented, no adenopathy, well-dressed, normal affect, normal respiratory effort. No edema noted in the foot and ankle with good skin lines and a palpable DP pulse.  Anterior later ankle pain with mild tenderness to palpation.  Mild tenderness later malleolus, non tender over the peroneal longus and brevis.  Subtalar joint motion is full.  Negative anterior drawer sign.  Mild tenderness over the PT tendon and navicular area.  No antalgic gait noted  with ambulation.      Imaging: No results found. No images are attached to the encounter.  Labs: Lab Results  Component Value Date   HGBA1C 6.1 (H) 05/30/2024   HGBA1C 6.0 11/16/2023   HGBA1C 5.7 03/07/2023     Lab Results  Component Value Date   ALBUMIN 4.2 05/30/2024   ALBUMIN 3.9 11/16/2023   ALBUMIN 4.1 03/07/2023    Lab Results  Component Value Date   MG 2.2 05/30/2024   Lab Results  Component Value Date   VD25OH 11.1 (L) 05/30/2024   VD25OH 12.92 (L) 06/22/2023   VD25OH 15.94 (L) 03/04/2023    No results found for: PREALBUMIN    Latest Ref Rng & Units 05/30/2024    8:48 AM 03/07/2023   11:31 AM 03/04/2023    9:16 AM  CBC EXTENDED  WBC 3.4 - 10.8 x10E3/uL 5.7  4.6  5.7   RBC 3.77 - 5.28 x10E6/uL 5.06  5.01  4.68   Hemoglobin 11.1 - 15.9 g/dL 86.9  87.4  88.1   HCT 34.0 - 46.6 % 41.7  39.7  37.2   Platelets 150 - 450 x10E3/uL 400  372.0  348.0   NEUT# 1.4 - 7.0 x10E3/uL 2.6  1.9  2.7  Lymph# 0.7 - 3.1 x10E3/uL 2.5  2.3  2.5      There is no height or weight on file to calculate BMI.  Orders:  Orders Placed This Encounter  Procedures   XR Ankle Complete Left   No orders of the defined types were placed in this encounter.    Procedures: No procedures performed  Clinical Data: No additional findings.  ROS:  All other systems negative, except as noted in the HPI. Review of Systems  Objective: Vital Signs: There were no vitals taken for this visit.  Specialty Comments:  No specialty comments available.  PMFS History: Patient Active Problem List   Diagnosis Date Noted   Fatigue due to excessive exertion 05/30/2024   Depression screening 05/30/2024   SOB (shortness of breath) on exertion 05/30/2024   Change in eating habits 05/01/2024   Migraine 05/01/2024   Insomnia 05/13/2022   Adjustment disorder with depressed mood 04/22/2022   Pre-diabetes 02/08/2022   B12 deficiency 02/08/2022   Vitamin D  deficiency 02/08/2022   Seasonal  allergies 01/06/2018   Bug bite 06/21/2013   Eczema 12/08/2010   Obesity 11/03/2009   Asthma, moderate persistent, well-controlled 03/07/2008   Asthma, well controlled 03/07/2008   Allergic rhinitis 10/27/2006   Past Medical History:  Diagnosis Date   Anxiety    Asthma    Depression    Fatigue    Obesity    Prediabetes    SOB (shortness of breath) on exertion    Vitamin B 12 deficiency     Family History  Problem Relation Age of Onset   Hypertension Mother    Sleep apnea Mother    Obesity Mother    Hyperlipidemia Father    Cancer Sister 40       Hodgkins lymphoma   Diabetes Maternal Grandfather    Hyperlipidemia Maternal Grandfather    Hypertension Maternal Grandfather     History reviewed. No pertinent surgical history. Social History   Occupational History   Occupation: Dietitian  Tobacco Use   Smoking status: Never   Smokeless tobacco: Never   Tobacco comments:    no smoking in home  Vaping Use   Vaping status: Never Used  Substance and Sexual Activity   Alcohol use: No   Drug use: No   Sexual activity: Not on file

## 2024-08-05 ENCOUNTER — Encounter: Payer: Self-pay | Admitting: Physician Assistant

## 2024-09-12 ENCOUNTER — Other Ambulatory Visit (INDEPENDENT_AMBULATORY_CARE_PROVIDER_SITE_OTHER): Payer: Self-pay | Admitting: Nurse Practitioner

## 2024-09-12 DIAGNOSIS — E88819 Insulin resistance, unspecified: Secondary | ICD-10-CM

## 2024-09-12 DIAGNOSIS — R7303 Prediabetes: Secondary | ICD-10-CM

## 2024-09-13 ENCOUNTER — Other Ambulatory Visit: Payer: Self-pay

## 2024-10-03 ENCOUNTER — Other Ambulatory Visit (HOSPITAL_BASED_OUTPATIENT_CLINIC_OR_DEPARTMENT_OTHER): Payer: Self-pay

## 2024-10-03 MED ORDER — METHYLPREDNISOLONE 4 MG PO TBPK
ORAL_TABLET | ORAL | 0 refills | Status: AC
  Filled 2024-10-03: qty 21, 6d supply, fill #0

## 2024-10-03 MED ORDER — MELOXICAM 15 MG PO TABS
15.0000 mg | ORAL_TABLET | Freq: Every day | ORAL | 2 refills | Status: AC
  Filled 2024-10-03: qty 30, 30d supply, fill #0

## 2024-10-04 ENCOUNTER — Other Ambulatory Visit (HOSPITAL_BASED_OUTPATIENT_CLINIC_OR_DEPARTMENT_OTHER): Payer: Self-pay
